# Patient Record
Sex: Female | Born: 1970 | Race: Black or African American | Hispanic: No | Marital: Single | Smoking: Former smoker
Health system: Southern US, Community
[De-identification: ages and names within clinical notes are randomized; demographics above are authoritative.]

## PROBLEM LIST (undated history)

## (undated) ENCOUNTER — Inpatient Hospital Stay (HOSPITAL_COMMUNITY): Payer: Self-pay

## (undated) DIAGNOSIS — E876 Hypokalemia: Secondary | ICD-10-CM

## (undated) DIAGNOSIS — E0590 Thyrotoxicosis, unspecified without thyrotoxic crisis or storm: Secondary | ICD-10-CM

## (undated) DIAGNOSIS — E079 Disorder of thyroid, unspecified: Secondary | ICD-10-CM

## (undated) DIAGNOSIS — I10 Essential (primary) hypertension: Secondary | ICD-10-CM

## (undated) DIAGNOSIS — K219 Gastro-esophageal reflux disease without esophagitis: Secondary | ICD-10-CM

## (undated) DIAGNOSIS — E039 Hypothyroidism, unspecified: Secondary | ICD-10-CM

## (undated) HISTORY — PX: DILATION AND CURETTAGE OF UTERUS: SHX78

## (undated) SURGERY — Surgical Case
Anesthesia: *Unknown

---

## 2003-06-27 ENCOUNTER — Emergency Department (HOSPITAL_COMMUNITY): Admission: EM | Admit: 2003-06-27 | Discharge: 2003-06-27 | Payer: Self-pay | Admitting: Emergency Medicine

## 2004-10-03 ENCOUNTER — Emergency Department (HOSPITAL_COMMUNITY): Admission: EM | Admit: 2004-10-03 | Discharge: 2004-10-03 | Payer: Self-pay | Admitting: Family Medicine

## 2004-10-06 ENCOUNTER — Ambulatory Visit: Payer: Self-pay | Admitting: Internal Medicine

## 2005-06-14 ENCOUNTER — Inpatient Hospital Stay (HOSPITAL_COMMUNITY): Admission: AD | Admit: 2005-06-14 | Discharge: 2005-06-14 | Payer: Self-pay | Admitting: Obstetrics and Gynecology

## 2005-06-17 ENCOUNTER — Inpatient Hospital Stay (HOSPITAL_COMMUNITY): Admission: AD | Admit: 2005-06-17 | Discharge: 2005-06-17 | Payer: Self-pay | Admitting: *Deleted

## 2006-03-19 ENCOUNTER — Ambulatory Visit: Payer: Self-pay | Admitting: Internal Medicine

## 2006-03-20 ENCOUNTER — Ambulatory Visit (HOSPITAL_COMMUNITY): Admission: RE | Admit: 2006-03-20 | Discharge: 2006-03-20 | Payer: Self-pay | Admitting: Internal Medicine

## 2006-06-14 ENCOUNTER — Ambulatory Visit: Payer: Self-pay | Admitting: Internal Medicine

## 2006-06-15 ENCOUNTER — Ambulatory Visit: Payer: Self-pay | Admitting: *Deleted

## 2007-01-24 ENCOUNTER — Emergency Department (HOSPITAL_COMMUNITY): Admission: EM | Admit: 2007-01-24 | Discharge: 2007-01-24 | Payer: Self-pay | Admitting: Emergency Medicine

## 2007-02-06 ENCOUNTER — Encounter (INDEPENDENT_AMBULATORY_CARE_PROVIDER_SITE_OTHER): Payer: Self-pay | Admitting: *Deleted

## 2007-03-08 ENCOUNTER — Emergency Department (HOSPITAL_COMMUNITY): Admission: EM | Admit: 2007-03-08 | Discharge: 2007-03-08 | Payer: Self-pay | Admitting: *Deleted

## 2007-09-02 ENCOUNTER — Inpatient Hospital Stay (HOSPITAL_COMMUNITY): Admission: AD | Admit: 2007-09-02 | Discharge: 2007-09-02 | Payer: Self-pay | Admitting: Obstetrics & Gynecology

## 2007-10-14 ENCOUNTER — Inpatient Hospital Stay (HOSPITAL_COMMUNITY): Admission: AD | Admit: 2007-10-14 | Discharge: 2007-10-16 | Payer: Self-pay | Admitting: Obstetrics and Gynecology

## 2007-10-14 ENCOUNTER — Encounter (INDEPENDENT_AMBULATORY_CARE_PROVIDER_SITE_OTHER): Payer: Self-pay | Admitting: Obstetrics and Gynecology

## 2008-09-03 IMAGING — CT CT HEAD W/O CM
1 series · 16 of 30 positions shown, 20 images · IV contrast (agent unspecified)
Comparison: none

CLINICAL DATA: Right arm numbness and weakness.   Recent syncopal episodes.  
 HEAD CT WITHOUT CONTRAST:
TECHNIQUE: Contiguous axial images were obtained from the base of the skull through the vertex according to standard protocol without contrast.

[Series 2: head_seq 4.5 h45s st · axial · 0.43mm/px · z∈[+1257,+1401]mm · 16 of 36 slices shown, 20 images]
[im 2/36  brain]
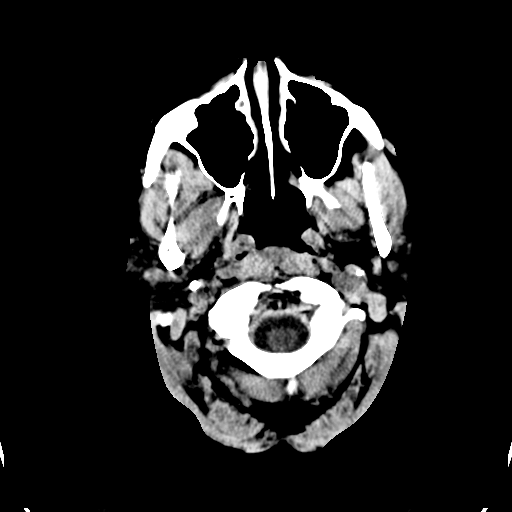
[im 2/36  bone]
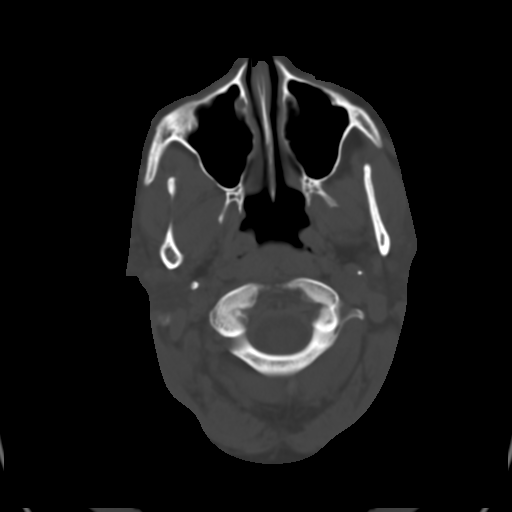
[im 4/36  brain]
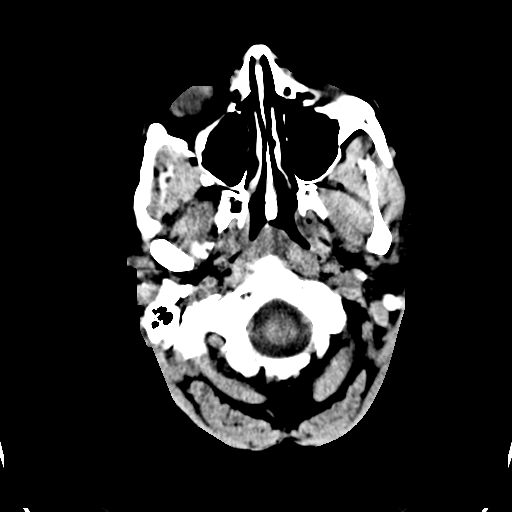
[im 7/36  brain]
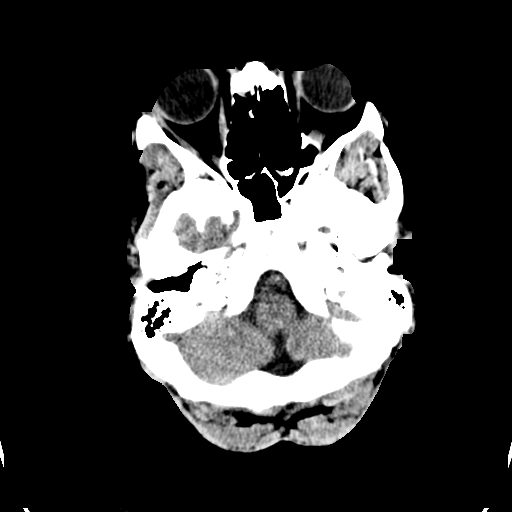
[im 9/36  brain]
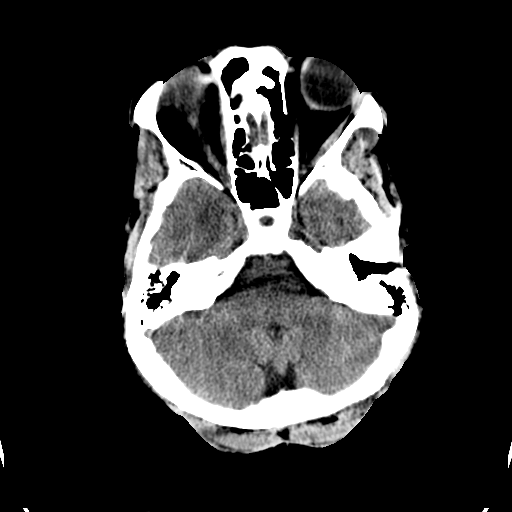
[im 10/36  brain]
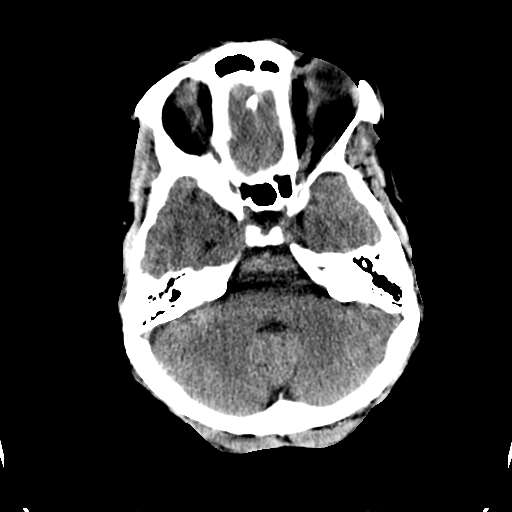
[im 10/36  bone]
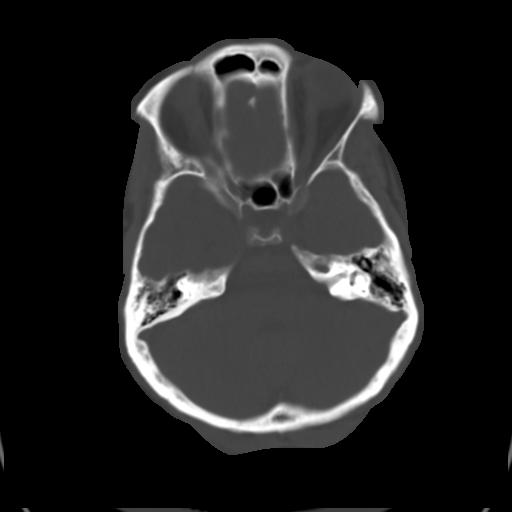
[im 13/36  brain]
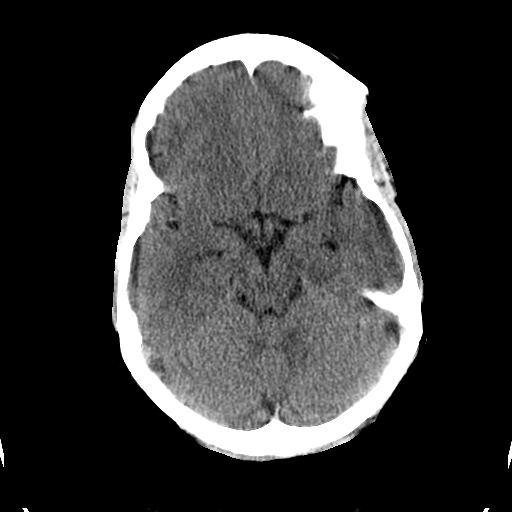
[im 15/36  brain]
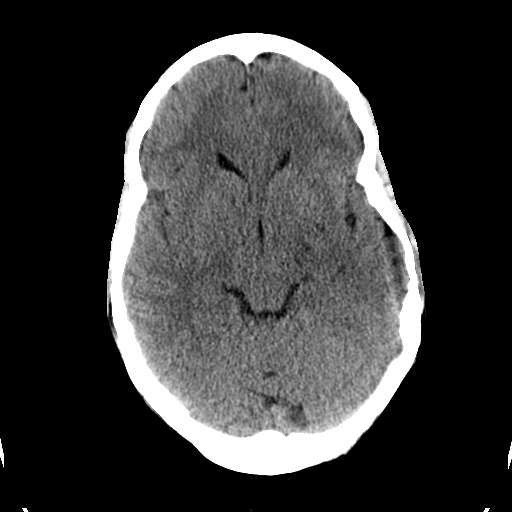
[im 17/36  brain]
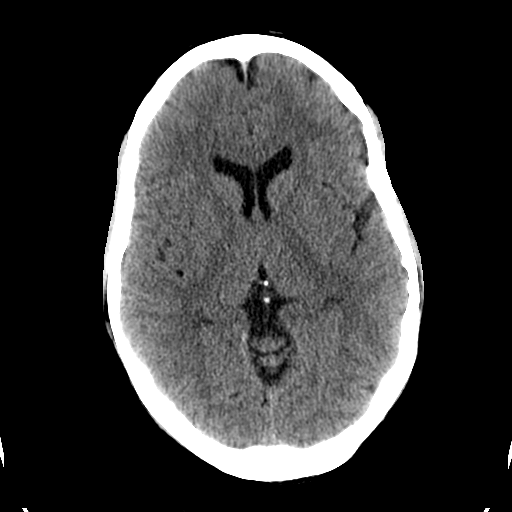
[im 19/36  brain]
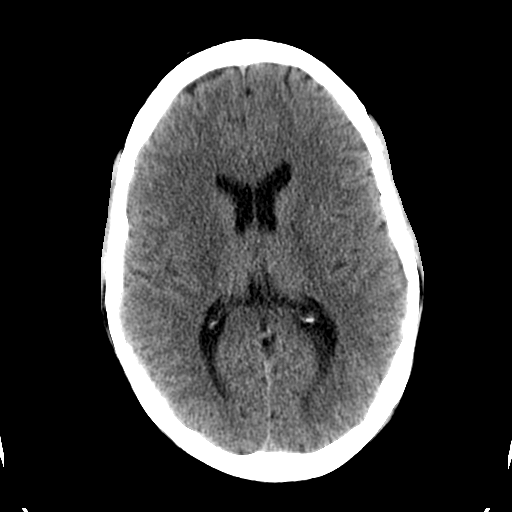
[im 19/36  bone]
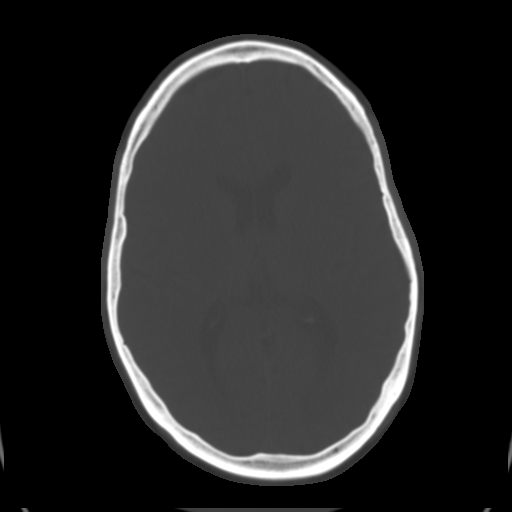
[im 21/36  brain]
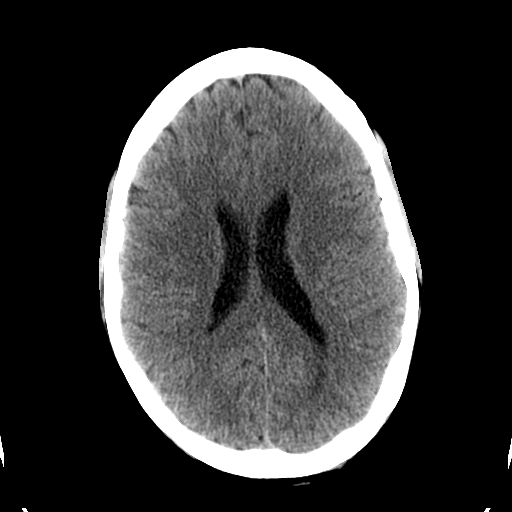
[im 23/36  brain]
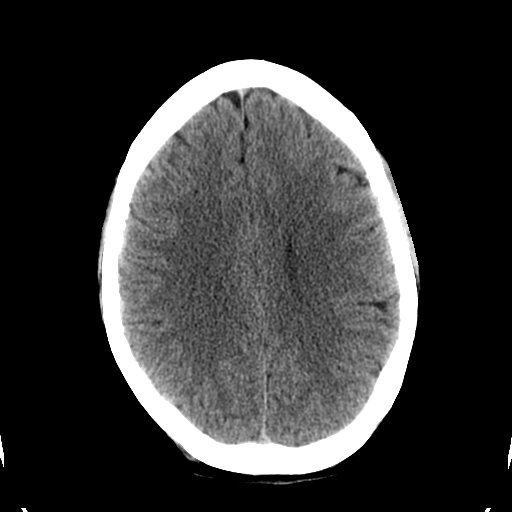
[im 26/36  brain]
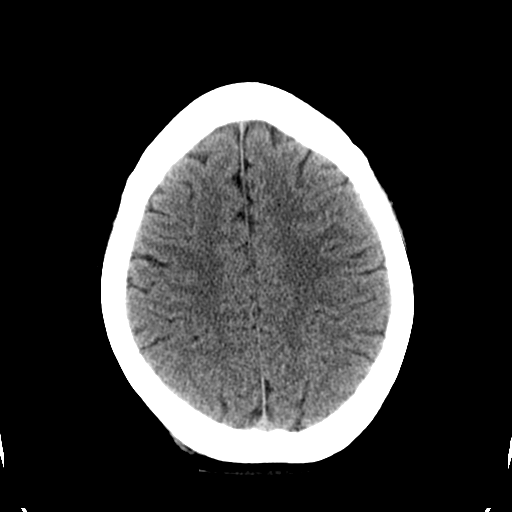
[im 27/36  brain]
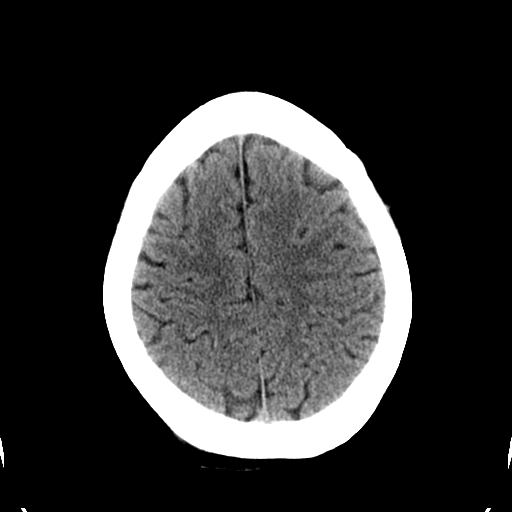
[im 27/36  bone]
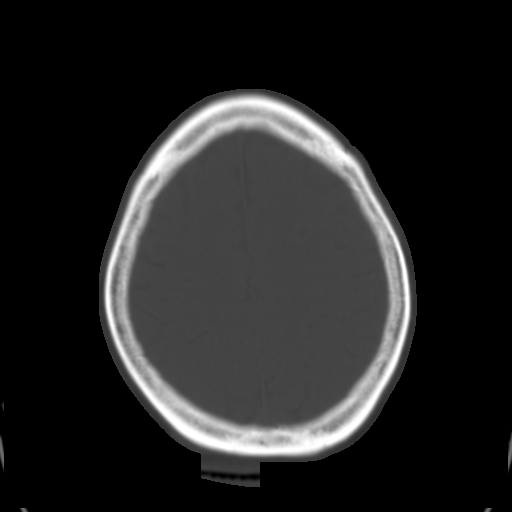
[im 29/36  brain]
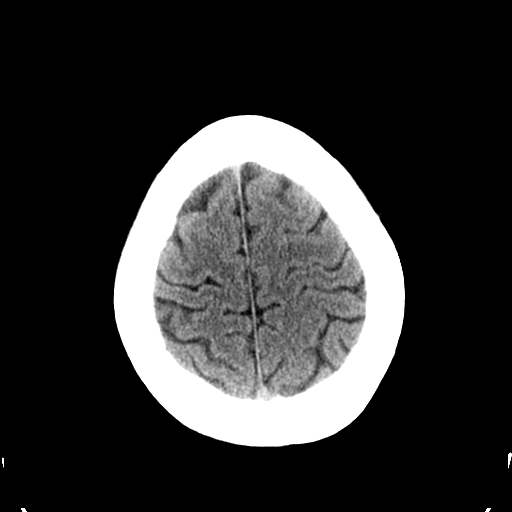
[im 32/36  brain]
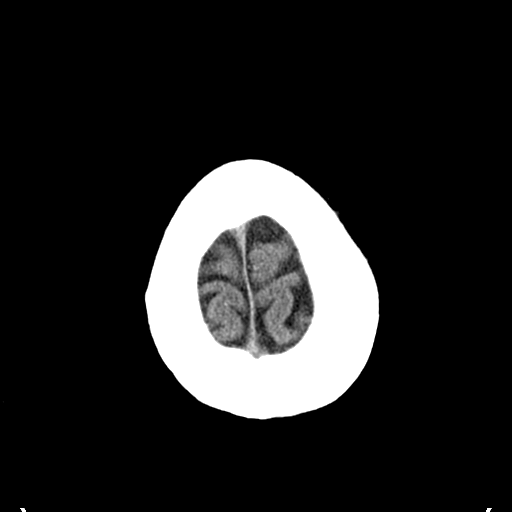
[im 34/36  brain]
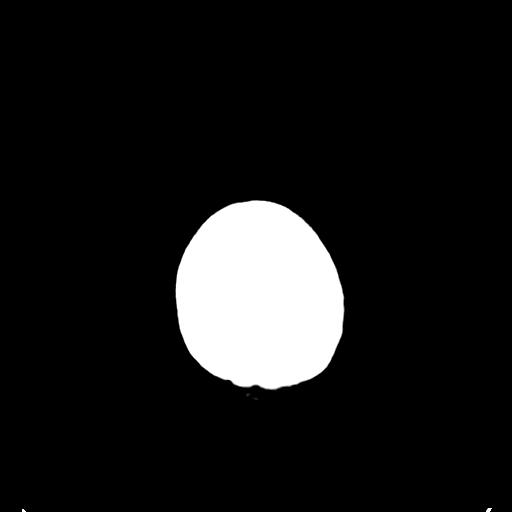

[16 of 30 positions shown; findings below may reference images not displayed]

FINDINGS: There is no evidence of intracranial hemorrhage, brain edema, acute 
 infarct, mass lesion, or mass effect.  No other intra-axial abnormalities 
 are seen, and the ventricles are within normal limits.  No abnormal 
 extra-axial fluid collections or masses are identified.  No skull 
 abnormalities are noted.
IMPRESSION: Negative non-contrast head CT.

## 2009-06-27 ENCOUNTER — Emergency Department (HOSPITAL_COMMUNITY): Admission: EM | Admit: 2009-06-27 | Discharge: 2009-06-27 | Payer: Self-pay | Admitting: Family Medicine

## 2009-12-02 ENCOUNTER — Emergency Department (HOSPITAL_COMMUNITY): Admission: EM | Admit: 2009-12-02 | Discharge: 2009-12-02 | Payer: Self-pay | Admitting: Family Medicine

## 2010-06-07 ENCOUNTER — Emergency Department (HOSPITAL_COMMUNITY)
Admission: EM | Admit: 2010-06-07 | Discharge: 2010-06-07 | Payer: Self-pay | Source: Home / Self Care | Admitting: Emergency Medicine

## 2010-06-08 LAB — POCT I-STAT, CHEM 8
BUN: 18 mg/dL (ref 6–23)
Calcium, Ion: 1.21 mmol/L (ref 1.12–1.32)
Chloride: 103 mEq/L (ref 96–112)
Creatinine, Ser: 0.9 mg/dL (ref 0.4–1.2)
Glucose, Bld: 78 mg/dL (ref 70–99)
HCT: 38 % (ref 36.0–46.0)
Hemoglobin: 12.9 g/dL (ref 12.0–15.0)
Potassium: 3.6 mEq/L (ref 3.5–5.1)
Sodium: 141 mEq/L (ref 135–145)
TCO2: 31 mmol/L (ref 0–100)

## 2010-06-08 LAB — POCT PREGNANCY, URINE: Preg Test, Ur: NEGATIVE

## 2010-07-19 ENCOUNTER — Inpatient Hospital Stay (INDEPENDENT_AMBULATORY_CARE_PROVIDER_SITE_OTHER)
Admission: RE | Admit: 2010-07-19 | Discharge: 2010-07-19 | Disposition: A | Discharge status: Home / Self Care | Payer: Self-pay | Source: Ambulatory Visit | Attending: Family Medicine | Admitting: Family Medicine

## 2010-07-19 DIAGNOSIS — M7750 Other enthesopathy of unspecified foot: Secondary | ICD-10-CM

## 2010-10-04 NOTE — Op Note (Signed)
Danielle [name], DANIS              ACCOUNT NO.:  [id number]   MEDICAL RECORD NO.:  [id number]          PATIENT TYPE:  INP   LOCATION:  9303                          FACILITY:  [facility]   PHYSICIAN:  Carrington Clamp, M.D. DATE OF BIRTH:  [birth year]   DATE OF PROCEDURE:  10/14/2007  DATE OF DISCHARGE:                               OPERATIVE REPORT   PREOPERATIVE DIAGNOSIS:  Intrauterine fetal demise at term with history  of previous cesarean section x2.   POSTOPERATIVE DIAGNOSIS:  Intrauterine fetal demise at term with history  of previous cesarean section x2.   PROCEDURE:  Repeat low-transverse cesarean section.   SURGEON:  Carrington Clamp, MD   ASSISTANT:  None.   ANESTHESIA:  Spinal.   FINDINGS:  Female infant vertex presentation with thick meconium on entry  into the amnion.  There was no nuchal cord seen.  The female infant  appeared in normal phenotypic and anatomic appearance.  The baby was  neither macerated nor edematous.  The cord was, however, well clotted.  Apgars were 0 and 0.  There were normal tubes, ovaries, and uterus  otherwise seen.   SPECIMENS:  Placenta to pathology.   ESTIMATED BLOOD LOSS:  600.   IV FLUIDS:  2800.   URINE OUTPUT:  200.   COMPLICATIONS:  None.   MEDICATIONS:  Pitocin and Ancef.   COUNTS:  Correct x3.   REASON FOR OPERATION:  Mrs. Marik called the office this morning to  speak to myself and doctor on-call to indicate that she had passed her  mucus plug.  When I asked her if she had also felt the baby move, she  indicated that she had not felt the baby move for a while.  I asked her  to come in to get checked out.  On presentation to the maternal  admissions unit, the nurse was unable to find heart tones with the  Doppler and this was confirmed by both bedside ultrasound by myself and  [facility] radiology ultrasound with Doppler color flow as well.  After discussing with the patient, the risks, benefits, and alternatives  of  repeat cesarean section as she had been planned versus a labor with  the cervix of only 1 cm, the patient agreed that the repeat cesarean  section to avoid the risk of uterine rupture in labor to help preserve  her health was the best plan and we proceeded to the operating room for  the cesarean section.   TECHNIQUE:  After adequate spinal anesthesia was achieved, the patient  was prepped and draped in usual sterile fashion in dorsal supine  position.  A Pfannenstiel skin incision was made with a scalpel and  carried down to the fascia with the Bovie cautery.  The fascia was  incised in midline and carried in transverse curvilinear manner with the  Mayo scissors.  A bowel free portion of the peritoneum was entered into  carefully with the Metzenbaum scissors and the rectus muscles were split  with the Mayo scissors and then the peritoneum incised in a superior to  inferior manner with the Metzenbaum scissors with  good visualization of  the bowel and bladder.  A small portion of the omentum was noted to be  adhesed to the peritoneum and this was removed with Bovie cautery with  good hemostasis.   The bladder blade was placed and the vesicouterine fascia tented up and  incised in a transverse curvilinear manner including adhesions of the  peritoneum to the anterior fundus.  The vesicouterine fascia was  dissected bluntly and sharply with the Metzenbaum scissors and the  bladder blade replaced after the bladder flap had been created.  A 2-cm  incision was made in the upper portion of lower uterine segment which  did not appear labored.  This was done until the amnion was seen and  then extended in transverse curvilinear manner with blunt dissection.  The amnion was ruptured with an Allis clamp and the baby identified in  the vertex presentation and delivered without complication.  The cord  which was clearly clotted off was clamped and cut.  The baby was handed  to awaiting labor and  delivery nurse who then was able to prepare the  baby for the parents to see later.  I was able to inspect the baby at  this time and found it to be phenotypically and anatomically normal in  appearance from the outside.  There had been no nuchal cord.   The placenta was then delivered manually and the uterus exteriorized,  wrapped in wet lap, and cleared of all debris.  A 0 Monocryl suture was  used in a running locked fashion to close the uterine incision.  An  imbricating layer of 0 Monocryl was performed as well.  An additional  figure-of-eight stitch was used to ensure hemostasis.  The uterus was  then reapproximated in the abdominal cavity and the abdomen was cleared  of all debris with irrigation.  The uterine incision was reinspected and  found to be hemostatic.  Once the patient was no longer vomiting and the  bowels could be replaced inside the abdominal cavity which had been  covered with a wet lap during the vomiting episode.  The peritoneum was  then closed with a running stitch of 2-0 Vicryl with the aid of a Fish.  This incorporated the rectus muscles as a separate layer.  The fascia  was then closed with running stitch of 0 Vicryl.  The subcutaneous  tissue was rendered hemostatic with Bovie cautery and irrigation.  This  was then closed with interrupted stitches of 2-0 plain gut.  The skin  was closed with staples.  The patient tolerated this procedure well and  was informed of the findings of the baby and that we would be sending  the placenta to the pathology and that blood work had been done to try  and find out a possible cause for the IFD.  The patient was also warned  that there might be no cause found.  The patient tolerated the procedure  well and she returned to recovery in stable condition.      Carrington Clamp, M.D.  Electronically Signed     MH/MEDQ  D:  10/14/2007  T:  10/14/2007  Job:  [id number]

## 2010-10-07 NOTE — Discharge Summary (Signed)
Danielle [name], Danielle [name]              ACCOUNT NO.:  [id number]   MEDICAL RECORD NO.:  [id number]          PATIENT TYPE:  INP   LOCATION:  9303                          FACILITY:  [facility]   PHYSICIAN:  Malva Limes, M.D.    DATE OF BIRTH:  [birth year]   DATE OF ADMISSION:  10/14/2007  DATE OF DISCHARGE:  10/16/2007                               DISCHARGE SUMMARY   FINAL DIAGNOSES:  1. Intrauterine fetal demise at term.  2. History of prior cesarean section x2.   PROCEDURE:  Repeat low-transverse cesarean section.   SURGEON:  Carrington Clamp, MD   COMPLICATIONS:  None.   This 40 year old, G7, P2-0-5-1 called the office this morning to  indicate she had passed her mucous plug.  When asked if she had felt the  baby move, she indicated she had not felt the baby move for a while, and  therefore, was advised to come in and get checked.  On presentation to  the MAU, the nurse was unable to find fetal heart tones with the Doppler  and this was confirmed by bedside ultrasound by Dr. Henderson Cloud, herself.  After discussing with the patient, this intrauterine fetal demise at  term and cervix was only 1 cm and history of 2 prior cesarean sections;  therefore, a decision was made to proceed with a cesarean section.  The  patient's otherwise antepartum course up to this point had been  complicated by history of hypothyroidism which she was on Synthroid for  during her pregnancy and her levels were checked every trimester and  were within normal limits.  The patient also had a history of chronic  hypertension, but was not on any medications during this pregnancy and  her blood pressures remained within normal limits.  Therefore, the  patient was taken to the operating room on Oct 14, 2007, by Dr. Carrington Clamp where a repeat low-transverse cesarean section was performed  with the delivery of a female infant in the vertex presentation with thick  meconium.  Infant appeared a normal phenotype and anatomy  appeared  normal.  The cord, however, was clotted and Apgars of course were 0 and  0.  The procedure went without complications.  The placenta was sent to  pathology.  The patient's postoperative course was stable.  She was felt  ready for discharge on postoperative day #2.  She was sent home on a  regular diet, told to decrease her activities, was given Percocet 1-2  every 4-6 hours as needed for pain, and was to follow up in our office  in 2 weeks.  Instructions and precautions were reviewed with the  patient.   LABORATORY DATA ON DISCHARGE:  She had a hemoglobin of 10.6 and white  blood cell count of 10.4.  A TORCH panel was performed and this seemed  to be normal as well.     [name], P.A.-C.    ______________________________  Malva Limes, M.D.   MB/MEDQ  D:  11/08/2007  T:  11/09/2007  Job:  [id number]

## 2011-02-14 LAB — COMPREHENSIVE METABOLIC PANEL
ALT: 16
AST: 16
Albumin: 2.8 — ABNORMAL LOW
Alkaline Phosphatase: 171 — ABNORMAL HIGH
BUN: 9
CO2: 23
Calcium: 8.6
Chloride: 103
Creatinine, Ser: 0.5
GFR calc Af Amer: >60
GFR calc non Af Amer: >60
Glucose, Bld: 72
Potassium: 3.7
Sodium: 134 — ABNORMAL LOW
Total Bilirubin: 0.4
Total Protein: 6.6

## 2011-02-14 LAB — CBC
HCT: 36.1
Hemoglobin: 12.4
MCHC: 34.5
MCV: 85.5
Platelets: 360
RBC: 4.22
RDW: 13.2
WBC: 12.1 — ABNORMAL HIGH

## 2011-02-14 LAB — TSH: TSH: 1.927

## 2011-02-15 LAB — CBC
HCT: 31.8 — ABNORMAL LOW
HCT: 32.2 — ABNORMAL LOW
HCT: 40.6
Hemoglobin: 10.6 — ABNORMAL LOW
Hemoglobin: 10.9 — ABNORMAL LOW
Hemoglobin: 13.6
MCHC: 33.3
MCHC: 33.3
MCHC: 33.8
MCV: 85.4
MCV: 85.5
MCV: 85.8
Platelets: 317
Platelets: 335
Platelets: 375
RBC: 3.7 — ABNORMAL LOW
RBC: 3.77 — ABNORMAL LOW
RBC: 4.76
RDW: 13.3
RDW: 13.4
RDW: 13.4
WBC: 10.4
WBC: 11.6 — ABNORMAL HIGH
WBC: 9.7

## 2011-02-15 LAB — TORCH-IGM(TOXO/ RUB/ CMV/ HSV) W TITER
CMV IgM: 0.59 IV
HSV IgM Antibody Titer: 0.34 IV
Rubella IgM Index: 0.63 IV
Toxoplasma IgM: 0.47 IV

## 2011-02-15 LAB — TORCH TITERS-IGG(TOXO/ RUB/ CMV/ HSV)
CMV IgG: 0.92 IV
HSV I/II IgG: >22.4 IV
Rubella IgG Scr: 69 IU/mL
Toxoplasma IgG Antibody (EIA): <5 IU/mL

## 2011-02-15 LAB — RPR: RPR Ser Ql: NONREACTIVE

## 2011-02-21 ENCOUNTER — Emergency Department (HOSPITAL_COMMUNITY): Payer: Self-pay

## 2011-02-21 ENCOUNTER — Emergency Department (HOSPITAL_COMMUNITY)
Admission: EM | Admit: 2011-02-21 | Discharge: 2011-02-21 | Disposition: A | Discharge status: Home / Self Care | Payer: Self-pay | Attending: Emergency Medicine | Admitting: Emergency Medicine

## 2011-02-21 DIAGNOSIS — M7750 Other enthesopathy of unspecified foot: Secondary | ICD-10-CM | POA: Insufficient documentation

## 2011-02-21 DIAGNOSIS — E039 Hypothyroidism, unspecified: Secondary | ICD-10-CM | POA: Insufficient documentation

## 2011-02-21 DIAGNOSIS — M79609 Pain in unspecified limb: Secondary | ICD-10-CM | POA: Insufficient documentation

## 2011-03-01 LAB — URINALYSIS, ROUTINE W REFLEX MICROSCOPIC
Bilirubin Urine: NEGATIVE
Glucose, UA: NEGATIVE
Ketones, ur: NEGATIVE
Leukocytes, UA: NEGATIVE
Nitrite: NEGATIVE
Protein, ur: NEGATIVE
Specific Gravity, Urine: 1.022
Urobilinogen, UA: 0.2
pH: 5.5

## 2011-03-01 LAB — GC/CHLAMYDIA PROBE AMP, GENITAL
Chlamydia, DNA Probe: NEGATIVE
GC Probe Amp, Genital: NEGATIVE

## 2011-03-01 LAB — WET PREP, GENITAL
Clue Cells Wet Prep HPF POC: NONE SEEN
Trich, Wet Prep: NONE SEEN
WBC, Wet Prep HPF POC: NONE SEEN
Yeast Wet Prep HPF POC: NONE SEEN

## 2011-03-01 LAB — PREGNANCY, URINE: Preg Test, Ur: POSITIVE

## 2011-03-01 LAB — URINE MICROSCOPIC-ADD ON

## 2011-03-01 LAB — HCG, QUANTITATIVE, PREGNANCY: hCG, Beta Chain, Quant, S: 70841 — ABNORMAL HIGH

## 2011-03-03 LAB — GC/CHLAMYDIA PROBE AMP, GENITAL
Chlamydia, DNA Probe: NEGATIVE
GC Probe Amp, Genital: NEGATIVE

## 2011-03-03 LAB — POCT PREGNANCY, URINE: Preg Test, Ur: NEGATIVE

## 2011-05-26 ENCOUNTER — Other Ambulatory Visit (HOSPITAL_COMMUNITY): Payer: Self-pay | Admitting: Physician Assistant

## 2011-05-26 DIAGNOSIS — Z1231 Encounter for screening mammogram for malignant neoplasm of breast: Secondary | ICD-10-CM

## 2011-06-28 ENCOUNTER — Ambulatory Visit (HOSPITAL_COMMUNITY): Payer: Self-pay

## 2011-06-30 ENCOUNTER — Ambulatory Visit (HOSPITAL_COMMUNITY)
Admission: RE | Admit: 2011-06-30 | Discharge: 2011-06-30 | Disposition: A | Discharge status: Home / Self Care | Payer: Self-pay | Source: Ambulatory Visit | Attending: Physician Assistant | Admitting: Physician Assistant

## 2011-06-30 DIAGNOSIS — Z1231 Encounter for screening mammogram for malignant neoplasm of breast: Secondary | ICD-10-CM

## 2012-01-02 ENCOUNTER — Emergency Department (HOSPITAL_COMMUNITY)
Admission: EM | Admit: 2012-01-02 | Discharge: 2012-01-03 | Disposition: A | Discharge status: Home / Self Care | Payer: Self-pay | Attending: Emergency Medicine | Admitting: Emergency Medicine

## 2012-01-02 ENCOUNTER — Encounter (HOSPITAL_COMMUNITY): Payer: Self-pay | Admitting: *Deleted

## 2012-01-02 DIAGNOSIS — A499 Bacterial infection, unspecified: Secondary | ICD-10-CM | POA: Insufficient documentation

## 2012-01-02 DIAGNOSIS — F17200 Nicotine dependence, unspecified, uncomplicated: Secondary | ICD-10-CM | POA: Insufficient documentation

## 2012-01-02 DIAGNOSIS — N760 Acute vaginitis: Secondary | ICD-10-CM | POA: Insufficient documentation

## 2012-01-02 DIAGNOSIS — B9689 Other specified bacterial agents as the cause of diseases classified elsewhere: Secondary | ICD-10-CM | POA: Insufficient documentation

## 2012-01-02 DIAGNOSIS — Z3490 Encounter for supervision of normal pregnancy, unspecified, unspecified trimester: Secondary | ICD-10-CM

## 2012-01-02 DIAGNOSIS — Z3201 Encounter for pregnancy test, result positive: Secondary | ICD-10-CM | POA: Insufficient documentation

## 2012-01-02 HISTORY — DX: Disorder of thyroid, unspecified: E07.9

## 2012-01-02 LAB — CBC WITH DIFFERENTIAL/PLATELET
Basophils Absolute: 0 10*3/uL (ref 0.0–0.1)
Basophils Relative: 0 % (ref 0–1)
Eosinophils Absolute: 0.2 10*3/uL (ref 0.0–0.7)
MCH: 29.7 pg (ref 26.0–34.0)
MCHC: 33.8 g/dL (ref 30.0–36.0)
Monocytes Absolute: 0.6 10*3/uL (ref 0.1–1.0)
Neutro Abs: 5.3 10*3/uL (ref 1.7–7.7)
Neutrophils Relative %: 57 % (ref 43–77)
RDW: 12.8 % (ref 11.5–15.5)

## 2012-01-02 LAB — URINALYSIS, ROUTINE W REFLEX MICROSCOPIC
Bilirubin Urine: NEGATIVE
Ketones, ur: NEGATIVE mg/dL
Nitrite: NEGATIVE
Urobilinogen, UA: 1 mg/dL (ref 0.0–1.0)
pH: 6.5 (ref 5.0–8.0)

## 2012-01-02 LAB — HCG, QUANTITATIVE, PREGNANCY: hCG, Beta Chain, Quant, S: 5713 m[IU]/mL — ABNORMAL HIGH (ref <5)

## 2012-01-02 LAB — URINE MICROSCOPIC-ADD ON

## 2012-01-02 LAB — BASIC METABOLIC PANEL
Chloride: 100 mEq/L (ref 96–112)
Creatinine, Ser: 0.72 mg/dL (ref 0.50–1.10)
GFR calc Af Amer: >90 mL/min (ref >90)
GFR calc non Af Amer: >90 mL/min (ref >90)
Potassium: 3.9 mEq/L (ref 3.5–5.1)

## 2012-01-02 NOTE — ED Notes (Signed)
Pt is here with a couple weeks of lower abdominal cramping, headaches, nauseated, vomiting intermittent, diarrhea and constipation.  LMP June took IUD out on June 7th and had a period after that.  No urinary symptoms

## 2012-01-02 NOTE — ED Provider Notes (Signed)
History     CSN: [id number]  Arrival date & time 01/02/12  1216   First MD Initiated Contact with Patient 01/02/12 2159      Chief Complaint  Patient presents with  . Abdominal Cramping    (Consider location/radiation/quality/duration/timing/severity/associated sxs/prior treatment) HPI Comments: Pt presents ambulatory with almost 3 weeks of vague, intermittent abdominal cramping, nausea, vomiting, and loose stools.  She is concerned that she may be pregnant.  She denies any significant pain, fevers, or bleeding and states she has not had a period in 2+ months.  Patient is a 41 y.o. female presenting with cramps. The history is provided by the patient. No language interpreter was used.  Abdominal Cramping The primary symptoms of the illness include fatigue, nausea, vomiting and diarrhea. The primary symptoms of the illness do not include fever, shortness of breath, hematemesis, hematochezia, dysuria, vaginal discharge or vaginal bleeding. The current episode started more than 2 days ago (2-3 weeks). The onset of the illness was gradual. The problem has not changed since onset. The nausea is associated with eating. The nausea is exacerbated by food, motion and activity.  The patient has had a change in bowel habit. Additional symptoms associated with the illness include constipation. Symptoms associated with the illness do not include chills, anorexia, diaphoresis, heartburn, urgency, hematuria, frequency or back pain. 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 Significant associated medical issues do not include PUD, GERD, inflammatory bowel disease, diabetes, sickle cell disease, gallstones, liver disease, diverticulitis or cardiac disease.    Past Medical History  Diagnosis Date  . Thyroid disease     hyper and hypo    Past Surgical History  Procedure Date  . Cesarean section     No family history on file.  History  Substance Use Topics  . Smoking  status: Current Everyday Smoker  . Smokeless tobacco: Not on file  . Alcohol Use: No    OB History    Grav Para Term Preterm Abortions TAB SAB Ect Mult Living                  Review of Systems  Constitutional: Positive for appetite change and fatigue. Negative for fever, chills, diaphoresis and activity change.  HENT: Negative.   Eyes: Negative.   Respiratory: Negative for shortness of breath.   Cardiovascular: Negative.   Gastrointestinal: Positive for nausea, vomiting, diarrhea and constipation. Negative for heartburn, hematochezia, anorexia and hematemesis.  Genitourinary: Positive for menstrual problem. Negative for dysuria, urgency, frequency, hematuria, flank pain, vaginal bleeding, vaginal discharge, vaginal pain and pelvic pain.  Musculoskeletal: Negative for back pain.  Neurological: Negative.   Hematological: Negative.   Psychiatric/Behavioral: Negative.     Allergies  Latex  Home Medications   Current Outpatient Rx  Name Route Sig Dispense Refill  . ADULT MULTIVITAMIN W/MINERALS CH Oral Take 1 tablet by mouth daily.      BP 132/89  Pulse 80  Temp 98.9 F (37.2 C) (Oral)  Resp 17  SpO2 100%  Physical Exam  Constitutional: She is oriented to person, place, and time. She appears well-developed and well-nourished. No distress.  HENT:  Head: Normocephalic and atraumatic.  Right Ear: External ear normal.  Left Ear: External ear normal.  Nose: Nose normal.  Mouth/Throat: Oropharynx is clear and moist. No oropharyngeal exudate.  Eyes: Conjunctivae and EOM are normal. Pupils are equal, round, and reactive to light. Right eye exhibits no discharge. Left eye exhibits no discharge. No scleral icterus.  Neck: Normal  range of motion. Neck supple. No JVD present. No tracheal deviation present. No thyromegaly present.  Cardiovascular: Normal rate, regular rhythm, normal heart sounds and intact distal pulses.  Exam reveals no gallop and no friction rub.   No murmur  heard. Pulmonary/Chest: Breath sounds normal. No stridor. No respiratory distress. She has no wheezes. She has no rales. She exhibits no tenderness.  Abdominal: Soft. Bowel sounds are normal. She exhibits no distension and no mass. There is no tenderness. There is no rebound and no guarding.  Musculoskeletal: Normal range of motion. She exhibits no edema and no tenderness.  Lymphadenopathy:    She has no cervical adenopathy.  Neurological: She is alert and oriented to person, place, and time. No cranial nerve deficit.  Skin: Skin is warm and dry. No rash noted. She is not diaphoretic. No erythema. No pallor.  Psychiatric: She has a normal mood and affect. Her behavior is normal. Judgment and thought content normal.    ED Course  Procedures (including critical care time)  Labs Reviewed  URINALYSIS, ROUTINE W REFLEX MICROSCOPIC - Abnormal; Notable for the following:    Leukocytes, UA TRACE (*)     All other components within normal limits  POCT PREGNANCY, URINE - Abnormal; Notable for the following:    Preg Test, Ur POSITIVE (*)     All other components within normal limits  HCG, QUANTITATIVE, PREGNANCY - Abnormal; Notable for the following:    hCG, Beta Chain, Quant, S 5713 (*)     All other components within normal limits  CBC WITH DIFFERENTIAL  BASIC METABOLIC PANEL  URINE MICROSCOPIC-ADD ON   No results found.   No diagnosis found.    MDM  Pt presents for evaluation of mild intermittent abdominal cramping for over 2 weeks, nausea, and several sporadic episodes of loose stools.  She is pain free currently and has remained stable in over 9 hours here in the ER.  She has a positive urine pregnancy test and normal belly labs.  There is no evidence of a UTI.  Plan obtain a quant beta Hcg and pelvic exam.  If no tenderness encountered, will discharge home to follow-up for routine prenatal care.  Note no pain or abnormality on pelvic exam.  The wet prep is positive for clue cells,  plan treat for BV.        Tobin Chad, MD 01/03/12 [phone]

## 2012-01-02 NOTE — ED Notes (Signed)
The pts friend and she checked in together and her friend just went back advised of wait time.  She has been here over 6 hours pleasant

## 2012-01-03 LAB — WET PREP, GENITAL
Trich, Wet Prep: NONE SEEN
Yeast Wet Prep HPF POC: NONE SEEN

## 2012-01-03 MED ORDER — PRENATABS RX 29-1 MG PO TABS: 1.0000 | ORAL_TABLET | ORAL | Status: DC

## 2012-01-03 MED ORDER — METRONIDAZOLE 500 MG PO TABS: 500.0000 mg | ORAL_TABLET | Freq: Two times a day (BID) | ORAL | Status: AC

## 2012-01-04 LAB — GC/CHLAMYDIA PROBE AMP, GENITAL
Chlamydia, DNA Probe: NEGATIVE
GC Probe Amp, Genital: NEGATIVE

## 2012-02-09 ENCOUNTER — Inpatient Hospital Stay (HOSPITAL_COMMUNITY)
Admission: AD | Admit: 2012-02-09 | Discharge: 2012-02-09 | Disposition: A | Discharge status: Home / Self Care | Payer: Medicaid Other | Source: Ambulatory Visit | Attending: Obstetrics & Gynecology | Admitting: Obstetrics & Gynecology

## 2012-02-09 ENCOUNTER — Encounter (HOSPITAL_COMMUNITY): Payer: Self-pay | Admitting: *Deleted

## 2012-02-09 DIAGNOSIS — O99340 Other mental disorders complicating pregnancy, unspecified trimester: Secondary | ICD-10-CM | POA: Insufficient documentation

## 2012-02-09 DIAGNOSIS — F411 Generalized anxiety disorder: Secondary | ICD-10-CM | POA: Insufficient documentation

## 2012-02-09 DIAGNOSIS — O99891 Other specified diseases and conditions complicating pregnancy: Secondary | ICD-10-CM | POA: Insufficient documentation

## 2012-02-09 DIAGNOSIS — K219 Gastro-esophageal reflux disease without esophagitis: Secondary | ICD-10-CM | POA: Insufficient documentation

## 2012-02-09 DIAGNOSIS — R002 Palpitations: Secondary | ICD-10-CM | POA: Insufficient documentation

## 2012-02-09 HISTORY — DX: Thyrotoxicosis, unspecified without thyrotoxic crisis or storm: E05.90

## 2012-02-09 LAB — CBC WITH DIFFERENTIAL/PLATELET
Basophils Absolute: 0 10*3/uL (ref 0.0–0.1)
Eosinophils Absolute: 0.1 10*3/uL (ref 0.0–0.7)
Eosinophils Relative: 1 % (ref 0–5)
HCT: 32.5 % — ABNORMAL LOW (ref 36.0–46.0)
Lymphocytes Relative: 22 % (ref 12–46)
MCH: 28.9 pg (ref 26.0–34.0)
MCHC: 33.2 g/dL (ref 30.0–36.0)
MCV: 86.9 fL (ref 78.0–100.0)
Monocytes Absolute: 0.4 10*3/uL (ref 0.1–1.0)
Platelets: 339 10*3/uL (ref 150–400)
RDW: 12.7 % (ref 11.5–15.5)
WBC: 8.5 10*3/uL (ref 4.0–10.5)

## 2012-02-09 LAB — URINALYSIS, ROUTINE W REFLEX MICROSCOPIC
Bilirubin Urine: NEGATIVE
Ketones, ur: NEGATIVE mg/dL
Leukocytes, UA: NEGATIVE
Nitrite: NEGATIVE
Protein, ur: NEGATIVE mg/dL
pH: 7 (ref 5.0–8.0)

## 2012-02-09 LAB — BASIC METABOLIC PANEL
BUN: 15 mg/dL (ref 6–23)
CO2: 24 mEq/L (ref 19–32)
Calcium: 9.1 mg/dL (ref 8.4–10.5)
Chloride: 100 mEq/L (ref 96–112)
Creatinine, Ser: 0.53 mg/dL (ref 0.50–1.10)
GFR calc Af Amer: >90 mL/min (ref >90)

## 2012-02-09 NOTE — MAU Note (Signed)
Palpitations in throat past couple wks.  Thought it would go away, it's not.  Hx of thyroid problems. IUD removed June 7, did have period,  + preg test at [facility] on 08/13.  Did not do an [place]

## 2012-02-09 NOTE — MAU Provider Note (Signed)
History     CSN: [id number]  Arrival date and time: 02/09/12 1453   Provider at bedside at 1630.    Chief Complaint  Patient presents with  . Palpitations   HPI Patient is a 41 y.o., [id number] AA female at [redacted]w[redacted]d who presents to the MAU complaining of heart palpitations for 2 weeks. She describes these as a feeling in her throat that extends to the left side of her chest like a "rush like I am startled," similar to how she has felt before with anxiety attacks.  She reports her heart racing intermittently. Palpitations usually last less than one minute in duration and occur multiple times daily.  Patient denies doing anything in particular to incite these events.  Has had minimal chest discomfort with some of the episodes.  Denies any significant pain, radiation of pain, chest tightness, chest pressure, or exertional component.  Denies shortness of breath. Does note heart palpitations upon standing too quickly, as well as light headedness and dizziness.  No episodes of syncope.  Has a history of high blood pressure but does not take any medications for this.  Has a hx of hypothyroidism and anemia.  She is not taking anything for these conditions either and has no medical provider.  Denies caffeine use, h/o trauma, blood loss, fevers, chills, syncope, shortness of breath, gynecologic concerns.  Has been having a significant increase in stress of late due to family issues with her young daughter.   Reports acid reflux over the past year ut denies difficulty swallowing.  Denies symptoms now.  Reports some cold intolerance at night but also reports occasionally feeling hot at night.  Denies dry skin, hair loss, and vision changes.  No prenatal care this pregnancy.  Wants to go to [facility].  No PCP.   OB History    Grav Para Term Preterm Abortions TAB SAB Ect Mult Living   7 3 3  3  0 2 1 0 1    Reports previous deliveries all by c-section. Reports fetal loss with first pregnancy due to  anoxic brain injury in third trimester because "baby was not taken out quickly enough". Reports fetal loss with most recent pregnancy due to umbilical cord clot.  Past Medical History  Diagnosis Date  . Thyroid disease     hyper and hypo  . Hyperthyroidism   "intermittent hypertension" "anemia" No PCP following medical issues because pt reports cannot afford insurance. No medications.  Past Surgical History  Procedure Date  . Cesarean section     History reviewed. No pertinent family history.  Per pt, h/o HTN and DM in mother and father, as well as thyroid disease Denies cardiac FH  History  Substance Use Topics  . Smoking status: Previous Every Day Smoker, quit 2 months ago  . Smokeless tobacco: Not on file  . Alcohol Use: No    Allergies:  Allergies  Allergen Reactions  . Latex Hives    On contact areas    Prescriptions prior to admission  Medication Sig Dispense Refill  . ibuprofen (ADVIL,MOTRIN) 200 MG tablet Take 200 mg by mouth every 6 (six) hours as needed. Head aches      . Prenatal Vit-Fe Fumarate-FA (PRENATAL MULTIVITAMIN) TABS Take 1 tablet by mouth daily.      Pt reports she does not take any medication.  Review of Systems  Constitutional: Negative for fever, chills, weight loss, malaise/fatigue and diaphoresis.  Respiratory: Negative.   Cardiovascular: Positive for chest pain and palpitations. Negative  for orthopnea, claudication, leg swelling and PND.  Gastrointestinal: Positive for constipation. Negative for heartburn, nausea, vomiting, abdominal pain, diarrhea, blood in stool and melena.  Genitourinary: Negative.   Neurological: Negative for weakness.     Physical Exam   Blood pressure 134/70, pulse 89, temperature 97.6 F (36.4 C), temperature source Oral, resp. rate 18, height 5' 5.5" (1.664 m), weight 88.451 kg (195 lb), last menstrual period 11/09/2011, SpO2 100.00%.  Physical Exam  Constitutional: She is oriented to person, place, and  time. She appears well-developed and well-nourished. No distress.  HENT:  Head: Normocephalic and atraumatic.  Eyes: Conjunctivae normal and EOM are normal.  Cardiovascular: Normal rate and regular rhythm.  Exam reveals no gallop and no friction rub.   Murmur heard.      2/6 systolic flow murmur   Respiratory: Effort normal and breath sounds normal. No respiratory distress. She has no wheezes. She has no rales. She exhibits no tenderness.  GI: Soft. Bowel sounds are normal. She exhibits no distension and no mass. There is no tenderness. There is no rebound and no guarding.  Neurological: She is alert and oriented to person, place, and time.  Skin: Skin is warm and dry. No rash noted. She is not diaphoretic. No erythema. No pallor.  MSK: Chest pain not reproducible HEENT: no thyromegaly PULM: nl effort EXTR: no edema Bedside sono : fetal heart tones auscultated   MAU Course  Procedures  MDM Results for orders placed during the hospital encounter of 02/09/12 (from the past 24 hour(s))  CBC WITH DIFFERENTIAL     Status: Abnormal   Collection Time   02/09/12  5:11 PM      Component Value Range   WBC 8.5  4.0 - 10.5 K/uL   RBC 3.74 (*) 3.87 - 5.11 MIL/uL   Hemoglobin 10.8 (*) 12.0 - 15.0 g/dL   HCT 82.9 (*) 56.2 - 13.0 %   MCV 86.9  78.0 - 100.0 fL   MCH 28.9  26.0 - 34.0 pg   MCHC 33.2  30.0 - 36.0 g/dL   RDW 86.5  78.4 - 69.6 %   Platelets 339  150 - 400 K/uL   Neutrophils Relative 72  43 - 77 %   Neutro Abs 6.1  1.7 - 7.7 K/uL   Lymphocytes Relative 22  12 - 46 %   Lymphs Abs 1.9  0.7 - 4.0 K/uL   Monocytes Relative 5  3 - 12 %   Monocytes Absolute 0.4  0.1 - 1.0 K/uL   Eosinophils Relative 1  0 - 5 %   Eosinophils Absolute 0.1  0.0 - 0.7 K/uL   Basophils Relative 0  0 - 1 %   Basophils Absolute 0.0  0.0 - 0.1 K/uL  BASIC METABOLIC PANEL     Status: Abnormal   Collection Time   02/09/12  5:22 PM      Component Value Range   Sodium 134 (*) 135 - 145 mEq/L   Potassium  3.5  3.5 - 5.1 mEq/L   Chloride 100  96 - 112 mEq/L   CO2 24  19 - 32 mEq/L   Glucose, Bld 95  70 - 99 mg/dL   BUN 15  6 - 23 mg/dL   Creatinine, Ser 2.95  0.50 - 1.10 mg/dL   Calcium 9.1  8.4 - 28.4 mg/dL   GFR calc non Af Amer >90  >90 mL/min   GFR calc Af Amer >90  >90 mL/min  URINALYSIS, ROUTINE W  REFLEX MICROSCOPIC     Status: Normal   Collection Time   02/09/12  5:30 PM      Component Value Range   Color, Urine YELLOW  YELLOW   APPearance CLEAR  CLEAR   Specific Gravity, Urine 1.020  1.005 - 1.030   pH 7.0  5.0 - 8.0   Glucose, UA NEGATIVE  NEGATIVE mg/dL   Hgb urine dipstick NEGATIVE  NEGATIVE   Bilirubin Urine NEGATIVE  NEGATIVE   Ketones, ur NEGATIVE  NEGATIVE mg/dL   Protein, ur NEGATIVE  NEGATIVE mg/dL   Urobilinogen, UA 0.2  0.0 - 1.0 mg/dL   Nitrite NEGATIVE  NEGATIVE   Leukocytes, UA NEGATIVE  NEGATIVE     Assessment and Plan  Patient is a 41 y.o., [id number] AA female at [redacted]w[redacted]d who presents to the MAU complaining of heart palpitations for 2 weeks.  1. Palpitations - Most likely anxiety or GERD as pt has history and is stressed currently.  CBC, UA, EKG, and BMET wnl making anemia, infection, cardiac abnormality/arrhythmia, and electrolyte abnormalities less likely.  However, cannot rule out cardiac etiology without EKG when pt having symptoms.  TSH ordered and pending. - F/u TSH as pt has h/o thyroid abnormality - Pt would benefit from prenatal care and regular medical care for more in-depth workup, including holter or event monitor, further workup of thyroid disease and medication management if necessary, evaluation for other endocrine issue such as DM, evaluation with thrombophilia panel (as endocrine abnormalities or blood clotting disorders can explain multiple SAB history and palpitations).  2. Pregnancy - Establish prenatal care as soon as possible, either at pt preferred [facility] or [facility].  Simone Curia 02/09/2012 8:44 PM  Originally created by Marcelline Mates, PA student, signed off to Simone Curia, MD, PGY-1.

## 2012-02-12 NOTE — MAU Provider Note (Signed)
I have seen and examined patient and reviewed EKG. Agree with above findings and plan. Napoleon Form, MD

## 2012-02-20 ENCOUNTER — Encounter (HOSPITAL_COMMUNITY): Payer: Self-pay

## 2012-02-20 ENCOUNTER — Inpatient Hospital Stay (HOSPITAL_COMMUNITY)
Admission: AD | Admit: 2012-02-20 | Discharge: 2012-02-20 | Disposition: A | Discharge status: Home / Self Care | Payer: Medicaid Other | Source: Ambulatory Visit | Attending: Family Medicine | Admitting: Family Medicine

## 2012-02-20 DIAGNOSIS — O99891 Other specified diseases and conditions complicating pregnancy: Secondary | ICD-10-CM | POA: Insufficient documentation

## 2012-02-20 DIAGNOSIS — R42 Dizziness and giddiness: Secondary | ICD-10-CM | POA: Insufficient documentation

## 2012-02-20 DIAGNOSIS — O2692 Pregnancy related conditions, unspecified, second trimester: Secondary | ICD-10-CM

## 2012-02-20 LAB — URINALYSIS, ROUTINE W REFLEX MICROSCOPIC
Bilirubin Urine: NEGATIVE
Hgb urine dipstick: NEGATIVE
Nitrite: NEGATIVE
Protein, ur: NEGATIVE mg/dL
Specific Gravity, Urine: 1.02 (ref 1.005–1.030)
Urobilinogen, UA: 0.2 mg/dL (ref 0.0–1.0)

## 2012-02-20 NOTE — MAU Provider Note (Signed)
History     CSN: [id number]  Arrival date and time: 02/20/12 1340   First Provider Initiated Contact with Patient 02/20/12 1417      Chief Complaint  Patient presents with  . Dizziness   HPI Danielle [name] is 41 y.o. [id number] [redacted]w[redacted]d weeks presenting with dizzy and not feeling well last night at work.  States she didn't eat well yesterday and thinks this could be the cause.  Denies visual changes, nausea, vomiting, or loss of balance.  Denies vaginal bleeding or discharge.  Patient is alert and oriented and is sitting up reading/studying.  She states she feels much better now.     Past Medical History  Diagnosis Date  . Thyroid disease     hyper and hypo  . Hyperthyroidism     Past Surgical History  Procedure Date  . Cesarean section     History reviewed. No pertinent family history.  History  Substance Use Topics  . Smoking status: Current Every Day Smoker  . Smokeless tobacco: Not on file  . Alcohol Use: No    Allergies:  Allergies  Allergen Reactions  . Latex Hives    On contact areas    Prescriptions prior to admission  Medication Sig Dispense Refill  . ibuprofen (ADVIL,MOTRIN) 200 MG tablet Take 200 mg by mouth every 6 (six) hours as needed. Head aches      . Prenatal Vit-Fe Fumarate-FA (PRENATAL MULTIVITAMIN) TABS Take 1 tablet by mouth daily.        Review of Systems  Constitutional: Negative.   HENT: Negative.   Eyes: Negative.   Respiratory: Negative.   Cardiovascular: Negative.   Gastrointestinal: Negative for nausea, vomiting and abdominal pain.  Genitourinary: Negative.   Neurological: Positive for dizziness.   Physical Exam   Blood pressure 137/71, pulse 89, resp. rate 18, height 5' 5.5" (1.664 m), weight 87.544 kg (193 lb), last menstrual period 11/09/2011.  Physical Exam  Constitutional: She is oriented to person, place, and time. She appears well-developed and well-nourished. No distress.  HENT:  Head: Normocephalic.  Cardiovascular:  Normal rate.   Respiratory: Effort normal.  GI: Soft. There is no tenderness.  Genitourinary:       Not indicated  Neurological: She is alert and oriented to person, place, and time.  Skin: Skin is warm and dry. She is not diaphoretic.  Psychiatric: She has a normal mood and affect. Her behavior is normal.    Bedside ultrasound (FHTs not heard with doppler long enough to get a rate)--- FHR 120-130 with + fetal movement observed. Results for orders placed during the hospital encounter of 02/20/12 (from the past 24 hour(s))  URINALYSIS, ROUTINE W REFLEX MICROSCOPIC     Status: Normal   Collection Time   02/20/12  1:56 PM      Component Value Range   Color, Urine YELLOW  YELLOW   APPearance CLEAR  CLEAR   Specific Gravity, Urine 1.020  1.005 - 1.030   pH 6.5  5.0 - 8.0   Glucose, UA NEGATIVE  NEGATIVE mg/dL   Hgb urine dipstick NEGATIVE  NEGATIVE   Bilirubin Urine NEGATIVE  NEGATIVE   Ketones, ur NEGATIVE  NEGATIVE mg/dL   Protein, ur NEGATIVE  NEGATIVE mg/dL   Urobilinogen, UA 0.2  0.0 - 1.0 mg/dL   Nitrite NEGATIVE  NEGATIVE   Leukocytes, UA NEGATIVE  NEGATIVE   MAU Course  Procedures  MDM 14:30  Discussed at length the importance of eating regular meals and staying well  hydrated.  Patient is waiting for Medicaid card to begin prenatal care  Assessment and Plan  A:  Dizziness in second gestation of pregnancy  P:  Discussed nutrition and importance of staying well hydrated    Begin prenatal care with doctor of your choice  Matt Holmes 02/20/2012, 2:17 PM

## 2012-02-20 NOTE — MAU Provider Note (Signed)
Chart reviewed and agree with management and plan.  

## 2012-02-20 NOTE — MAU Note (Signed)
Patient is in with c/o dizziness. She states that she felt an intense dizziness at work yesterday. She denies any pain, vaginal bleeding or discharge. 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

## 2012-03-08 ENCOUNTER — Encounter (HOSPITAL_COMMUNITY): Payer: Self-pay | Admitting: *Deleted

## 2012-03-29 ENCOUNTER — Other Ambulatory Visit: Payer: Self-pay

## 2012-03-30 LAB — OB RESULTS CONSOLE RUBELLA ANTIBODY, IGM: Rubella: IMMUNE

## 2012-03-30 LAB — OB RESULTS CONSOLE HIV ANTIBODY (ROUTINE TESTING): HIV: NONREACTIVE

## 2012-03-30 LAB — OB RESULTS CONSOLE RPR: RPR: NONREACTIVE

## 2012-04-02 ENCOUNTER — Other Ambulatory Visit: Payer: Self-pay | Admitting: Obstetrics

## 2012-04-02 DIAGNOSIS — Z3689 Encounter for other specified antenatal screening: Secondary | ICD-10-CM

## 2012-04-02 DIAGNOSIS — O09529 Supervision of elderly multigravida, unspecified trimester: Secondary | ICD-10-CM

## 2012-04-05 ENCOUNTER — Ambulatory Visit (HOSPITAL_COMMUNITY)
Admission: RE | Admit: 2012-04-05 | Discharge: 2012-04-05 | Disposition: A | Discharge status: Home / Self Care | Payer: Medicaid Other | Source: Ambulatory Visit | Attending: Obstetrics | Admitting: Obstetrics

## 2012-04-05 ENCOUNTER — Encounter (HOSPITAL_COMMUNITY): Payer: Self-pay

## 2012-04-05 DIAGNOSIS — O289 Unspecified abnormal findings on antenatal screening of mother: Secondary | ICD-10-CM | POA: Insufficient documentation

## 2012-04-05 DIAGNOSIS — O09529 Supervision of elderly multigravida, unspecified trimester: Secondary | ICD-10-CM | POA: Insufficient documentation

## 2012-04-05 DIAGNOSIS — O358XX0 Maternal care for other (suspected) fetal abnormality and damage, not applicable or unspecified: Secondary | ICD-10-CM | POA: Insufficient documentation

## 2012-04-05 DIAGNOSIS — O34219 Maternal care for unspecified type scar from previous cesarean delivery: Secondary | ICD-10-CM | POA: Insufficient documentation

## 2012-04-05 DIAGNOSIS — Z3689 Encounter for other specified antenatal screening: Secondary | ICD-10-CM

## 2012-04-05 DIAGNOSIS — Z363 Encounter for antenatal screening for malformations: Secondary | ICD-10-CM | POA: Insufficient documentation

## 2012-04-05 DIAGNOSIS — Z1389 Encounter for screening for other disorder: Secondary | ICD-10-CM | POA: Insufficient documentation

## 2012-04-05 DIAGNOSIS — O09299 Supervision of pregnancy with other poor reproductive or obstetric history, unspecified trimester: Secondary | ICD-10-CM | POA: Insufficient documentation

## 2012-04-05 NOTE — Consult Note (Signed)
MFM Note  Ms. Samonte is a 41 year old AA female at 18+ weeks who presents for consultation due to her age and poor obstetric history. Her OB history is as follows: (historian: patient)  G1: EAB by D&C without complications; ~ 1989  G2: Urgent C/S under general anesthesia at term; ~ 6 lbs female; infant stayed in NICU x 5 1/2 months before dying; patient states that she was not delivered soon enough; denies prenatal complications  G3: Ectopic; treated with medication; ~1993  G4: EAB by D&C without complications; ~ 1997  G5: Repeat C/S at term; 12+9 female; no prenatal or postnatal complications; 11/07/2000  G6: SAB; no D&C; + pregnancy test but no [place]; ~2005  G7: Repeat C/S after IUFD at 38+ weeks (2 days before scheduled C/S); denies any prenatal complications or postnatal complications; female; per operative note: thick meconium, no nuchal or body cord, no maceration, appeared to be phenotypically normal female; no autopsy or chromosomes; TORCH titers were negative; placental pathology: 1) slight acute chorioamnionitis 2) intervillositis with microabscess formation and infarcts 3) villitis also present 4) no organisms found in paraffin block; 10/14/2007  Medical hx: hyperthyroidism dx'ed in 1998 secondary to Grave's disease; rx'ed with medication; then pt decided to discontinue   Surgical hx: D&E x 2; C/S x 3  Medications: PNV  Allergies: none  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 Marczak received genetic counseling today; she decided to decline all prenatal testing; normal detailed [place] today 3) H/O IUFD at term; + meconium; unknown cause except for possible infectious etiology due to placental pathology findings - intervillositis/no organisms found 4) Hx hyperthyroidism; TSH levels normal 04/09 and 09/13  Recommendations: 1) Serial growth USs 2) Antenatal testing at 32 weeks 3) Baseline TSH  level 4) S/P C/S x 3 5) Based on her OB history, would offer delivery ~ 37-38 weeks by repeat C/S   It was a pleasure meeting Ms. Suchy today. Please call with any questions or concerns. Looking forward to following her with you.  (Face-to-face consultation with patient: 45 min)

## 2012-04-05 NOTE — Progress Notes (Signed)
Genetic Counseling  High-Risk Gestation Note  Appointment Date:  04/05/2012 Referred By: Brock Bad, MD Date of Birth:  [birth year]    Pregnancy History: [id number] Estimated Date of Delivery: 09/04/12 Estimated Gestational Age: [redacted]w[redacted]d Attending: Particia Nearing, MD    Ms. PEARLENE HICKMOTT was seen for genetic counseling regarding a maternal age of 41 y.o. and a screen positive risk for Down syndrome based on Quad screen.  She was counseled regarding maternal age and the association with risk for chromosome conditions due to nondisjunction with aging of the ova.   We reviewed chromosomes, nondisjunction, and the associated 1 in 5 risk for fetal aneuploidy related to a maternal age of 41 y.o. at [redacted]w[redacted]d gestation.  She was counseled that the risk for aneuploidy decreases as gestational age increases, accounting for those pregnancies which spontaneously abort.  We specifically discussed Down syndrome (trisomy 71), trisomies 36 and 71, and sex chromosome aneuploidies (47,XXX and 47,XXY) including the common features and prognoses of each.   We also reviewed Ms. Bezek's maternal serum Quad screen result and the associated decrease in risk for fetal Down syndrome (1 in 56 to 1 in 1 in 127). However, we discussed that 1 in 127 is considered screen positive given that it is above the lab's cutoff.  She was counseled regarding other explanations for a screen positive result including normal variation and differences in maternal metabolism.  In addition, we reviewed the screen adjusted reduction in risks for trisomy 18 (1 in 55) and ONTDs (1 in 5,550).  She understands that Quad screening provides a pregnancy specific risk for these conditions, but is not considered to be diagnostic.    We reviewed other available screening options including noninvasive prenatal testing (NIPT) and detailed ultrasound.  Specifically, we discussed that NIPT analyzes cell free fetal DNA found in the maternal circulation.  This test is not diagnostic for chromosome conditions, but can provide information regarding the presence or absence of extra fetal DNA for chromosomes 13, 18, 21, X, and Y, and missing fetal DNA for chromosome X and Y (Turner syndrome). Thus, it would not identify or rule out all genetic conditions. The reported detection rate is greater than 99% for Trisomy 21, greater than 98% for Trisomy 18, and is approximately 80% (8 out of 10) for Trisomy 13. The false positive rate is reported to be less than 0.1% for any of these conditions.  In addition, we discussed that ~50-80% of fetuses with Down syndrome and up to 90-95% of fetuses with trisomy 18/13, when well visualized, have detectable anomalies or soft markers by detailed ultrasound (~18+ weeks gestation).   Ms. ARRINGTON KREBSBACH was also counseled regarding diagnostic testing via amniocentesis.  We reviewed the approximate 1 in 300-500 risk for complications, including spontaneous pregnancy loss. After consideration of all the options, Ms. Przybyl elected to proceed with targeted ultrasound only and declined NIPT and amniocentesis today. The ultrasound report will be sent under separate cover.  Ms. Hoole was provided with written information regarding sickle cell anemia (SCA) including the carrier frequency and incidence in the African-American population, the availability of carrier testing and prenatal diagnosis if indicated.  In addition, we discussed that hemoglobinopathies are routinely screened for as part of the [facility] newborn screening panel.  We discussed that OB medical records indicated that sickle cell screening was performed and was negative, indicating that she does not have sickle cell trait. Screening for additional less common hemoglobin variants is available via hemoglobin electrophoresis, if desired.  Both family histories were reviewed and found to be contributory for the patient's first child, a daughter, with meconium stained  amniotic fluid at delivery and subsequent brain damage and seizures after delivery reportedly due to oxygen deprivation at birth. She reportedly died at age 55 and a half months. She also reported a full term fetal demise, a son, in 2009. In the case of an environmental cause for her daughter's features, recurrence risk for relatives would not be expected to be increased. However, in the case of a different underlying cause, recurrence risk assessment may be altered.   The patient reported a female maternal first cousin once removed with autism. An underlying etiology was not known. We discussed that autism is part of the spectrum of conditions referred to as Autistic spectrum disorders (ASD).  We discussed that ASDs are among the most common neurodevelopmental disorders, with approximately 1 in 88 children meeting criteria for ASD.  Approximately 80% of individuals diagnosed are female. There is strong evidence that genetic factors play a critical role in development of ASD.  There have been recent advances in identifying specific genetic causes of ASD, however, there are still many individuals for whom the etiology of the ASD is not known.  Once a family has a child with a diagnosis of ASD, there is a 13.5% chance to have another child with ASD.  Given the reported family history and degree of relation, recurrence risk to the current pregnancy would not likely be increased above the general population risk, in the case of multifactorial inheritance. They understand that at this time there is not genetic testing available for ASD for most families.    Ms. Geremia was not familiar with the father of the baby's family history.  We, therefore, cannot comment on how his history might contribute to the overall chance for the baby to have a birth defect.  Without further information regarding the provided family history, an accurate genetic risk cannot be calculated. Further genetic counseling is warranted if more  information is obtained.  Ms. CHESSIE NEUHARTH denied exposure to environmental toxins or chemical agents. She denied the use of alcohol, tobacco or street drugs. She denied significant viral illnesses during the course of her pregnancy. Her medical and surgical histories were contributory for previous stillbirth, as previously discussed. Ms. Hruska was seen for Maternal Fetal Medicine consultation to discuss pregnancy management given her previous pregnancy history. See separate MFM consult note for detailed discussion.      I counseled this couple regarding the above risks and available options. The approximate face-to-face time with the genetic counselor was 45 minutes.    Quinn Plowman, MS,  Certified Genetic Counselor 04/05/2012

## 2012-04-09 ENCOUNTER — Encounter (HOSPITAL_COMMUNITY): Payer: Self-pay | Admitting: MS"

## 2012-05-17 ENCOUNTER — Encounter (HOSPITAL_COMMUNITY): Payer: Self-pay

## 2012-05-17 ENCOUNTER — Ambulatory Visit (HOSPITAL_COMMUNITY)
Admission: RE | Admit: 2012-05-17 | Discharge: 2012-05-17 | Disposition: A | Discharge status: Home / Self Care | Payer: Medicaid Other | Source: Ambulatory Visit | Attending: Obstetrics | Admitting: Obstetrics

## 2012-05-17 VITALS — BP 130/67 | HR 92 | Wt 206.0 lb

## 2012-05-17 DIAGNOSIS — O09299 Supervision of pregnancy with other poor reproductive or obstetric history, unspecified trimester: Secondary | ICD-10-CM | POA: Insufficient documentation

## 2012-05-17 DIAGNOSIS — O34219 Maternal care for unspecified type scar from previous cesarean delivery: Secondary | ICD-10-CM | POA: Insufficient documentation

## 2012-05-17 DIAGNOSIS — Z3689 Encounter for other specified antenatal screening: Secondary | ICD-10-CM

## 2012-05-17 DIAGNOSIS — O09529 Supervision of elderly multigravida, unspecified trimester: Secondary | ICD-10-CM | POA: Insufficient documentation

## 2012-05-17 DIAGNOSIS — O289 Unspecified abnormal findings on antenatal screening of mother: Secondary | ICD-10-CM | POA: Insufficient documentation

## 2012-05-17 NOTE — Progress Notes (Signed)
Danielle [name]  was seen today for an ultrasound appointment.  See full report in [facility].  Single IUP at 24 2/7 weeks Interval growth is appropriate (62nd %tile) Normal interval anatomy Subjectively increased amniotic fluid volume  Recommend follow-up ultrasound examination in 4 weeks for growth and amniotic fluid reassessment.  Danielle Gula, MD

## 2012-06-14 ENCOUNTER — Ambulatory Visit (HOSPITAL_COMMUNITY)
Admission: RE | Admit: 2012-06-14 | Discharge: 2012-06-14 | Disposition: A | Discharge status: Home / Self Care | Payer: Medicaid Other | Source: Ambulatory Visit | Attending: Obstetrics | Admitting: Obstetrics

## 2012-06-14 DIAGNOSIS — O09529 Supervision of elderly multigravida, unspecified trimester: Secondary | ICD-10-CM | POA: Insufficient documentation

## 2012-06-14 DIAGNOSIS — O409XX0 Polyhydramnios, unspecified trimester, not applicable or unspecified: Secondary | ICD-10-CM | POA: Insufficient documentation

## 2012-06-14 DIAGNOSIS — O34219 Maternal care for unspecified type scar from previous cesarean delivery: Secondary | ICD-10-CM | POA: Insufficient documentation

## 2012-06-14 DIAGNOSIS — O09299 Supervision of pregnancy with other poor reproductive or obstetric history, unspecified trimester: Secondary | ICD-10-CM | POA: Insufficient documentation

## 2012-06-14 DIAGNOSIS — Z3689 Encounter for other specified antenatal screening: Secondary | ICD-10-CM

## 2012-06-14 DIAGNOSIS — O289 Unspecified abnormal findings on antenatal screening of mother: Secondary | ICD-10-CM | POA: Insufficient documentation

## 2012-06-14 NOTE — ED Notes (Signed)
Patient describes increased normal vaginal discharge, clear in color. Does not feel water is broken. Positive fetal movement noted

## 2012-07-10 ENCOUNTER — Other Ambulatory Visit (HOSPITAL_COMMUNITY): Payer: Self-pay | Admitting: Obstetrics

## 2012-07-10 DIAGNOSIS — O09529 Supervision of elderly multigravida, unspecified trimester: Secondary | ICD-10-CM

## 2012-07-12 ENCOUNTER — Ambulatory Visit (HOSPITAL_COMMUNITY)
Admission: RE | Admit: 2012-07-12 | Discharge: 2012-07-12 | Disposition: A | Discharge status: Home / Self Care | Payer: Medicaid Other | Source: Ambulatory Visit | Attending: Obstetrics | Admitting: Obstetrics

## 2012-07-12 VITALS — BP 113/67 | HR 92 | Wt 216.5 lb

## 2012-07-12 DIAGNOSIS — O409XX0 Polyhydramnios, unspecified trimester, not applicable or unspecified: Secondary | ICD-10-CM | POA: Insufficient documentation

## 2012-07-12 DIAGNOSIS — O34219 Maternal care for unspecified type scar from previous cesarean delivery: Secondary | ICD-10-CM | POA: Insufficient documentation

## 2012-07-12 DIAGNOSIS — O09299 Supervision of pregnancy with other poor reproductive or obstetric history, unspecified trimester: Secondary | ICD-10-CM | POA: Insufficient documentation

## 2012-07-12 DIAGNOSIS — O09529 Supervision of elderly multigravida, unspecified trimester: Secondary | ICD-10-CM | POA: Insufficient documentation

## 2012-07-12 DIAGNOSIS — O289 Unspecified abnormal findings on antenatal screening of mother: Secondary | ICD-10-CM | POA: Insufficient documentation

## 2012-07-12 NOTE — Progress Notes (Signed)
[facility] ultrasound  Indication: 42 yr old [id number] at [redacted]w[redacted]d with history of full term fetal demise and previous finding of polyhydramnios for follow up fetal growth ultrasound.  Findings: 1. Single intrauterine pregnancy. 2. Estimated fetal weight is in the 75th%. 3. Posterior placenta without evidence of previa. 4. Normal amniotic fluid index; although slightly increased for gestational age. 5. The limited anatomy survey is normal. The bowel appeared mildly dilated on today's exam.  Recommendations: 1. Appropriate fetal growth. 2. Previous finding of polyhydramnios: - previously counseled - amniotic fluid index normal - recommend continue antenatal testing with twice weekly nonstress tests and weekly amniotic fluid index 3. Advanced maternal age: - previously counseled - had quad screen which showed risk of trisomy 14 of 1:127 which is screen positive but improved over her age related risk - patient declined cell free fetal DNA or amniocentesis - recommend continue antenatal testing  - recommend delivery by estimated due date 4. History of fetal demise: - previously counseled - I am not aware of the etiology or what work up has been done I recommend fetal kick counts- instructions were given and patient is to call with decreased fetal movement. I do not recommend early delivery based on this history. Recommend delivery at 39-[redacted] weeks gestation in the absence of other complications. If patient has significant anxiety she can be delivered after 37 weeks with an amniocentesis showing fetal lung maturity. 5. Recommend ultrasound for fetal growth in 4 weeks. Will reevaluate fetal bowel at that time.  Eulis Foster, MD

## 2012-07-30 ENCOUNTER — Other Ambulatory Visit: Payer: Self-pay | Admitting: Obstetrics & Gynecology

## 2012-07-30 ENCOUNTER — Encounter (HOSPITAL_COMMUNITY): Payer: Self-pay | Admitting: Pharmacist

## 2012-08-02 LAB — OB RESULTS CONSOLE GBS: GBS: POSITIVE

## 2012-08-07 ENCOUNTER — Ambulatory Visit: Payer: Medicaid Other | Admitting: Obstetrics & Gynecology

## 2012-08-07 DIAGNOSIS — O0990 Supervision of high risk pregnancy, unspecified, unspecified trimester: Secondary | ICD-10-CM

## 2012-08-07 DIAGNOSIS — O09529 Supervision of elderly multigravida, unspecified trimester: Secondary | ICD-10-CM

## 2012-08-07 DIAGNOSIS — Z3480 Encounter for supervision of other normal pregnancy, unspecified trimester: Secondary | ICD-10-CM

## 2012-08-07 LAB — POCT URINALYSIS DIPSTICK
Ketones, UA: NEGATIVE
Spec Grav, UA: 1.025
Urobilinogen, UA: NEGATIVE
pH, UA: 5

## 2012-08-09 ENCOUNTER — Ambulatory Visit (HOSPITAL_COMMUNITY)
Admission: RE | Admit: 2012-08-09 | Discharge: 2012-08-09 | Disposition: A | Discharge status: Home / Self Care | Payer: Medicaid Other | Source: Ambulatory Visit | Attending: Obstetrics & Gynecology | Admitting: Obstetrics & Gynecology

## 2012-08-09 DIAGNOSIS — O09299 Supervision of pregnancy with other poor reproductive or obstetric history, unspecified trimester: Secondary | ICD-10-CM | POA: Insufficient documentation

## 2012-08-09 DIAGNOSIS — O34219 Maternal care for unspecified type scar from previous cesarean delivery: Secondary | ICD-10-CM | POA: Insufficient documentation

## 2012-08-09 DIAGNOSIS — O289 Unspecified abnormal findings on antenatal screening of mother: Secondary | ICD-10-CM | POA: Insufficient documentation

## 2012-08-09 DIAGNOSIS — O09529 Supervision of elderly multigravida, unspecified trimester: Secondary | ICD-10-CM

## 2012-08-09 DIAGNOSIS — O409XX0 Polyhydramnios, unspecified trimester, not applicable or unspecified: Secondary | ICD-10-CM | POA: Insufficient documentation

## 2012-08-09 NOTE — Progress Notes (Signed)
Danielle [name]  was seen today for an ultrasound appointment.  See full report in [facility].  Impression: Single IUP at 36 2/7 week Follow up due to hx of term IUFD and mild polhydramnios Interval growth is appropriate (81st %tile) Normal interval anatomy Subjectively increased amniotic fluid volume  Recommendations: Follow-up ultrasounds as clinically indicated.   Alpha Gula, MD

## 2012-08-12 NOTE — Addendum Note (Signed)
Encounter addended by: Ty Hilts, RN on: 08/12/2012  8:15 AM<BR>     Documentation filed: Charges VN, OB Prenatal Vitals, Episodes

## 2012-08-13 ENCOUNTER — Other Ambulatory Visit: Payer: Self-pay | Admitting: *Deleted

## 2012-08-13 ENCOUNTER — Other Ambulatory Visit: Payer: Medicaid Other

## 2012-08-13 ENCOUNTER — Ambulatory Visit (INDEPENDENT_AMBULATORY_CARE_PROVIDER_SITE_OTHER): Payer: Medicaid Other | Admitting: Obstetrics & Gynecology

## 2012-08-13 ENCOUNTER — Encounter (HOSPITAL_COMMUNITY)
Admission: RE | Admit: 2012-08-13 | Discharge: 2012-08-13 | Disposition: A | Discharge status: Home / Self Care | Payer: Medicaid Other | Source: Ambulatory Visit | Attending: Obstetrics & Gynecology | Admitting: Obstetrics & Gynecology

## 2012-08-13 ENCOUNTER — Encounter (HOSPITAL_COMMUNITY): Payer: Self-pay

## 2012-08-13 DIAGNOSIS — Z3480 Encounter for supervision of other normal pregnancy, unspecified trimester: Secondary | ICD-10-CM

## 2012-08-13 DIAGNOSIS — O0990 Supervision of high risk pregnancy, unspecified, unspecified trimester: Secondary | ICD-10-CM | POA: Insufficient documentation

## 2012-08-13 DIAGNOSIS — O09529 Supervision of elderly multigravida, unspecified trimester: Secondary | ICD-10-CM

## 2012-08-13 HISTORY — DX: Gastro-esophageal reflux disease without esophagitis: K21.9

## 2012-08-13 HISTORY — DX: Hypothyroidism, unspecified: E03.9

## 2012-08-13 LAB — TYPE AND SCREEN
ABO/RH(D): A POS
Antibody Screen: NEGATIVE

## 2012-08-13 LAB — CBC
HCT: 35.1 % — ABNORMAL LOW (ref 36.0–46.0)
Hemoglobin: 11.8 g/dL — ABNORMAL LOW (ref 12.0–15.0)
MCHC: 33.6 g/dL (ref 30.0–36.0)
WBC: 7.9 10*3/uL (ref 4.0–10.5)

## 2012-08-13 LAB — RPR: RPR Ser Ql: NONREACTIVE

## 2012-08-13 LAB — POCT URINALYSIS DIPSTICK
Bilirubin, UA: NEGATIVE
Blood, UA: NEGATIVE
Ketones, UA: NEGATIVE
Leukocytes, UA: NEGATIVE
Spec Grav, UA: 1.01
pH, UA: 7

## 2012-08-13 MED ORDER — DEXTROSE 5 % IV SOLN: 1.0000 g | Freq: Once | INTRAVENOUS | Status: DC

## 2012-08-13 NOTE — Patient Instructions (Addendum)
20 DEBE ANFINSON  08/13/2012   Your procedure is scheduled on:  08/15/12  Enter through the Main Entrance of [facility] at 1030 AM.  Pick up the phone at the desk and dial [phone].   Call this number if you have problems the morning of surgery: [phone]   Remember:   Do not eat food:After Midnight.  Do not drink clear liquids: After Midnight.  Take these medicines the morning of surgery with A SIP OF WATER: NA   Do not wear jewelry, make-up or nail polish.  Do not wear lotions, powders, or perfumes. You may wear deodorant.  Do not shave 48 hours prior to surgery.  Do not bring valuables to the hospital.  Contacts, dentures or bridgework may not be worn into surgery.  Leave suitcase in the car. After surgery it may be brought to your room.  For patients admitted to the hospital, checkout time is 11:00 AM the day of discharge.   Patients discharged the day of surgery will not be allowed to drive home.  Name and phone number of your driver: NA  Special Instructions: Incentive Spirometry - Practice and bring it with you on the day of surgery. Shower using CHG 2 nights before surgery and the night before surgery.  If you shower the day of surgery use CHG.  Use special wash - you have one bottle of CHG for all showers.  You should use approximately 1/3 of the bottle for each shower.   Please read over the following fact sheets that you were given: Surgical Site Infection Prevention

## 2012-08-13 NOTE — Progress Notes (Signed)
Doing well 

## 2012-08-13 NOTE — Patient Instructions (Signed)
Amniocentesis Amniocentesis (amnio) is the removal of a small amount of fluid that surrounds the baby in the amniotic sac. Once the fluid is removed, it may be examined to find answers to a number of serious questions. An amnio is often done early in pregnancy (between 15 and 17 weeks) to determine if there is a complication with the baby, or it is done later in pregnancy (between 6633 and 37 weeks) to see if the baby's lungs are mature. Amnios that are done later in pregnancy are often done to help weigh the risks and benefits of a needed early delivery. Amnios done early in the second trimester are commonly referred to as genetic amnios, as they are typically done to check for a potential life-altering genetic abnormality. Rarely, an amnio is done to evaluate the amniotic fluid for concerns of infection. Amniocentesis may be done for other reasons, including:   If the mother is 42 years old or older. This is because of the increased risk of chromosome abnormalities, such as Down's syndrome or various chromosomal trisomies.  To determine any genetic problems.  To look for signs of infection in the uterus.  To determine if the baby's lungs are mature enough for the baby to survive outside of the uterus. This information is important in pregnancies when the baby must be delivered early. LET YOUR CAREGIVER KNOW ABOUT:  Any complications you have had with the pregnancy, such as bleeding or contractions.  Allergies.  Medicines taken, including vitamins, herbs, eyedrops, over-the-counter medicines, and creams.  Use of steroids (by mouth or creams).  Previous problems with numbing medicines.  History of bleeding problems or blood clots.  Previous surgery.  Other health problems. RISKS AND COMPLICATIONS  Complications can include:  Vaginal bleeding.  Transmission of an infection from mother to baby, such as hepatitis C or other viruses.  Leaking of amniotic fluid.  Premature  labor.  Fetal injury.  Injury to the placenta.  Miscarriage (rare). This procedure is done only if your caregiver feels the information obtained from the procedure justifies the risk. Amnios should not be performed before 15 weeks of pregnancy unless it is absolutely necessary. BEFORE THE PROCEDURE   Ask your caregiver any questions you may have.  Eat as usual.  Drink enough fluids to keep your urine clear or pale yellow.  You may want to arrange for someone to drive you home after the procedure. PROCEDURE  A careful ultrasound is done to evaluate the baby for its position and for where the best pockets of fluid lie. The mother's abdomen is prepped with a solution to prevent infections. Often, a sterile ultrasound probe is used to watch the location of the amniocentesis needle being used. A small spot of the skin may be injected with a numbing medicine. In that location, a longer needle is introduced through the skin and down to the level of the baby. Amniotic fluid is removed into a syringe and sent to the lab for evaluation. AFTER THE PROCEDURE   Ask your caregiver if you need a RhoGam shot.  You will rest for 1 to 2 hours for observation.  Your baby will be placed on a monitor for 1 to 2 hours to see if there are any problems.  You may develop cramping and a small amount of vaginal bleeding right after the amnio.  Ask when your test results will be ready. Make sure you get your test results. Document Released: 05/05/2000 Document Revised: 07/31/2011 Document Reviewed: 03/13/2011 [employer] Patient Information  [address], [place].

## 2012-08-14 ENCOUNTER — Encounter (HOSPITAL_COMMUNITY): Payer: Self-pay | Admitting: *Deleted

## 2012-08-14 ENCOUNTER — Inpatient Hospital Stay (HOSPITAL_COMMUNITY)
Admission: AD | Admit: 2012-08-14 | Discharge: 2012-08-14 | Disposition: A | Discharge status: Home / Self Care | Payer: Medicaid Other | Source: Ambulatory Visit | Attending: Obstetrics & Gynecology | Admitting: Obstetrics & Gynecology

## 2012-08-14 ENCOUNTER — Ambulatory Visit (HOSPITAL_COMMUNITY)
Admission: RE | Admit: 2012-08-14 | Discharge: 2012-08-14 | Disposition: A | Discharge status: Home / Self Care | Payer: Medicaid Other | Source: Ambulatory Visit | Attending: Obstetrics & Gynecology | Admitting: Obstetrics & Gynecology

## 2012-08-14 DIAGNOSIS — O409XX0 Polyhydramnios, unspecified trimester, not applicable or unspecified: Secondary | ICD-10-CM | POA: Insufficient documentation

## 2012-08-14 DIAGNOSIS — O09529 Supervision of elderly multigravida, unspecified trimester: Secondary | ICD-10-CM | POA: Insufficient documentation

## 2012-08-14 DIAGNOSIS — O0990 Supervision of high risk pregnancy, unspecified, unspecified trimester: Secondary | ICD-10-CM | POA: Insufficient documentation

## 2012-08-14 DIAGNOSIS — O34219 Maternal care for unspecified type scar from previous cesarean delivery: Secondary | ICD-10-CM | POA: Insufficient documentation

## 2012-08-14 DIAGNOSIS — O289 Unspecified abnormal findings on antenatal screening of mother: Secondary | ICD-10-CM | POA: Insufficient documentation

## 2012-08-14 DIAGNOSIS — O09299 Supervision of pregnancy with other poor reproductive or obstetric history, unspecified trimester: Secondary | ICD-10-CM | POA: Insufficient documentation

## 2012-08-14 NOTE — MAU Note (Signed)
Patient states she was sent to MAU for a NST post amnio. Patient is scheduled for a repeat cesarean section 3-27 pending the results of today's testing.

## 2012-08-14 NOTE — MAU Note (Signed)
Dr. Tamela Oddi notified of pt reactive NST.  Orders for discharge rec'd.

## 2012-08-14 NOTE — Procedures (Signed)
A timeout was performed confirming the patient procedure and allergy status. The abdomen was prepped and draped. An amniocentesis was performed under ultrasound guidance. 20 cc of clear fluid was aspirated. The fetal heart rate postprocedure was within normal limits.

## 2012-08-15 ENCOUNTER — Encounter: Payer: Self-pay | Admitting: Obstetrics & Gynecology

## 2012-08-15 LAB — LSPG (L/S RATIO WITH PG)-AMNIO FLUID

## 2012-08-15 NOTE — Patient Instructions (Signed)
Amniocentesis Care After These instructions give you information on caring for yourself after your procedure. Your doctor may also give you more specific instructions. Call your doctor if you have any problems or questions after your procedure. HOME CARE  Take all medicine as told by your doctor.  Eat as you normally do.  Drink enough fluids to keep your pee (urine) clear or pale yellow.  Avoid having sex (intercourse) until your doctor says it is okay.  Do not lift anything heavy for the rest of the day. GET HELP RIGHT AWAY IF:   You are bleeding from your vagina and it will not stop.  You have very bad cramps that do not go away.  You have fluid coming from your vagina.  You have a fever.  You do not feel the baby moving like normal. MAKE SURE YOU:  Understand these instructions.  Will watch your condition.  Will get help right away if you are not doing well or get worse. Document Released: 06/10/2010 Document Revised: 07/31/2011 Document Reviewed: 03/13/2011 [facility] Patient Information 2013 [place], [place].

## 2012-08-15 NOTE — Progress Notes (Signed)
Subjective:    Danielle [name] is a 42 y.o. female being seen today for her obstetrical visit. She is at [redacted]w[redacted]d gestation. Patient reports no complaints. Fetal movement: normal.  Menstrual History: OB History   Grav Para Term Preterm Abortions TAB SAB Ect Mult Living   8 3 3  4 2 1 1  0 1       The following portions of the patient's history were reviewed and updated as appropriate: allergies, current medications, past family history, past medical history, past social history, past surgical history and problem list.  Review of Systems Pertinent items are noted in HPI.   Objective:    LMP 11/29/2011 FHT: 140 BPM  Uterine Size: size equals dates  Presentations: cephalic                            Assessment:    Pregnancy @[redacted]w[redacted]d ; poor OB history NST reactive  Plan:   Plans for delivery: amnio for FLM--> C/D Beta strep culture: done Counseling: Consent signed. Fetal testing: discussed

## 2012-08-16 ENCOUNTER — Ambulatory Visit (HOSPITAL_COMMUNITY): Payer: Medicaid Other

## 2012-08-19 ENCOUNTER — Ambulatory Visit (INDEPENDENT_AMBULATORY_CARE_PROVIDER_SITE_OTHER): Payer: Medicaid Other | Admitting: Obstetrics & Gynecology

## 2012-08-19 ENCOUNTER — Other Ambulatory Visit (HOSPITAL_COMMUNITY): Payer: Medicaid Other

## 2012-08-19 ENCOUNTER — Other Ambulatory Visit: Payer: Medicaid Other

## 2012-08-19 VITALS — BP 136/82 | Temp 96.7°F | Wt 217.0 lb

## 2012-08-19 DIAGNOSIS — O0990 Supervision of high risk pregnancy, unspecified, unspecified trimester: Secondary | ICD-10-CM

## 2012-08-19 DIAGNOSIS — O09529 Supervision of elderly multigravida, unspecified trimester: Secondary | ICD-10-CM

## 2012-08-19 LAB — POCT URINALYSIS DIPSTICK
Bilirubin, UA: NEGATIVE
Blood, UA: NEGATIVE
Ketones, UA: NEGATIVE
pH, UA: 5

## 2012-08-19 NOTE — Patient Instructions (Addendum)
Cesarean Delivery  Cesarean delivery is the birth of a baby through a cut (incision) in the abdomen and womb (uterus).  LET YOUR CAREGIVER KNOW ABOUT:  Complicationsinvolving the pregnancy.  Allergies.  Medicines taken including herbs, eyedrops, over-the-counter medicines, and creams.  Use of steroids (by mouth or creams).  Previous problems with anesthetics or numbing medicine.  Previous surgery.  History of blood clots.  History of bleeding or blood problems.  Other health problems. RISKS AND COMPLICATIONS   Bleeding.  Infection.  Blood clots.  Injury to surrounding organs.  Anesthesia problems.  Injury to the baby. BEFORE THE PROCEDURE   A tube (Foley catheter) will be placed in your bladder. The Foley catheter drains the urine from your bladder into a bag. This keeps your bladder empty during surgery.  An intravenous access tube (IV) will be placed in your arm.  Hair may be removed from your pubic area and your lower abdomen. This is to prevent infection in the incision site.  You may be given an antacid medicine to drink. This will prevent acid contents in your stomach from going into your lungs if you vomit during the surgery.  You may be given an antibiotic medicine to prevent infection. PROCEDURE   You may be given medicine to numb the lower half of your body (regional anesthetic). If you were in labor, you may have already had an epidural in place which can be used in both labor and cesarean delivery. You may possibly be given medicine to make you sleep (general anesthetic) though this is not as common.  An incision will be made in your abdomen that extends to your uterus. There are 2 basic kinds of incisions:  The horizontal (transverse) incision. Horizontal incisions are used for most routine cesarean deliveries.  The vertical (up and down) incision. This is less commonly used. This is most often reserved for women who have a serious complication  (extreme prematurity) or under emergency situations.  The horizontal and vertical incisions may both be used at the same time. However, this is very uncommon.  Your baby will then be delivered. AFTER THE PROCEDURE   If you were awake during the surgery, you will see your baby right away. If you were asleep, you will see your baby as soon as you are awake.  You may breastfeed your baby after surgery.  You may be able to get up and walk the same day as the surgery. If you need to stay in bed for a period of time, you will receive help to turn, cough, and take deep breaths after surgery. This helps prevent lung problems such as pneumonia.  Do not get out of bed alone the first time after surgery. You will need help getting out of bed until you are able to do this by yourself.  You may be able to shower the day after your cesarean delivery. After the bandage (dressing) is taken off the incision site, a nurse will assist you to shower, if you like.  You will have pneumatic compressing hose placed on your feet or lower legs. These hose are used to prevent blood clots. When you are up and walking regularly, they will no longer be necessary.  Do not cross your legs when you sit.  Save any blood clots that you pass. If you pass a clot while on the toilet, do not flush it. Call for the nurse. Tell the nurse if you think you are bleeding too much or passing too many   clots.  Start drinking liquids and eating food as directed by your caregiver. If your stomach is not ready, drinking and eating too soon can cause an increase in bloating and swelling of your intestine and abdomen. This is very uncomfortable.  You will be given medicine as needed. Let your caregivers know if you are hurting. They want you to be comfortable. You may also be given an antibiotic to prevent an infection.  Your IV will be taken out when you are drinking a reasonable amount of fluids. The Foley catheter is taken out when  you are up and walking.  If your blood type is Rh negative and your baby's blood type is Rh positive, you will be given a shot of anti-D immune globulin. This shot prevents you from having Rh problems with a future pregnancy. You should get the shot even if you had your tubes tied (tubal ligation).  If you are allowed to take the baby for a walk, place the baby in the bassinet and push it. Do not carry your baby in your arms. Document Released: 05/08/2005 Document Revised: 07/31/2011 Document Reviewed: 09/02/2010 [employer] Patient Information 2013 [employer].  

## 2012-08-19 NOTE — Progress Notes (Signed)
Pulse: 92

## 2012-08-20 ENCOUNTER — Encounter: Payer: Self-pay | Admitting: Obstetrics & Gynecology

## 2012-08-20 ENCOUNTER — Other Ambulatory Visit (HOSPITAL_COMMUNITY): Payer: Medicaid Other

## 2012-08-20 NOTE — Progress Notes (Signed)
NST OK. 

## 2012-08-21 ENCOUNTER — Ambulatory Visit: Payer: Medicaid Other | Admitting: Obstetrics & Gynecology

## 2012-08-21 NOTE — H&P (Signed)
Danielle Danielle [name] Danielle [name] is Danielle [name] 42 y.o. female presenting for an elective repeat C/D. Maternal Medical History:  Reason for admission: For an elective repeat C/D.  Complicated OB history.    OB History   Grav Para Term Preterm Abortions TAB SAB Ect Mult Living   8 3 3  4 2 1 1  0 1     Past Medical History  Diagnosis Date  . Thyroid disease     hyper and hypo  . Hyperthyroidism   . Hypothyroidism   . GERD (gastroesophageal reflux disease)    Past Surgical History  Procedure Laterality Date  . Cesarean section    . Dilation and curettage of uterus     Family History: family history includes Cancer in her father; Diabetes in her father; and Hypertension in her mother. Social History:  reports that she has quit smoking. She does not have any smokeless tobacco history on file. She reports that she does not drink alcohol or use illicit drugs.     Review of Systems  Constitutional: Negative for fever.  Eyes: Negative for blurred vision.  Respiratory: Negative for shortness of breath.   Gastrointestinal: Negative for vomiting.  Skin: Negative for rash.  Neurological: Negative for headaches.      Last menstrual period 11/29/2011. Maternal Exam:  Abdomen: Patient reports no abdominal tenderness. Fundal height is term.   Fetal presentation: vertex  Introitus: not evaluated.     Fetal Exam Fetal Monitor Review: Variability: moderate (6-25 bpm).   Pattern: accelerations present and no decelerations.    Fetal State Assessment: Category I - tracings are normal.     Physical Exam  Constitutional: She appears well-developed.  HENT:  Head: Normocephalic.  Neck: Neck supple. No thyromegaly present.  Cardiovascular: Normal rate and regular rhythm.   Respiratory: Breath sounds normal.  GI: Soft. Bowel sounds are normal.  Skin: No rash noted.    Prenatal labs: ABO, Rh: --/--/Danielle [name] POS (03/25 1210) Antibody: NEG (03/25 1210) Rubella:   RPR: NON REACTIVE (03/25 1210)  HBsAg:     HIV:    GBS:     Assessment/Plan: Multipara at [redacted]w[redacted]d.    H/O previous IUFD at term/C/D.  Declines TOLAC.  Admit Repeat C/D  Danielle Danielle [name] Danielle [name],Danielle Danielle [name] Danielle [name] 08/21/2012, 5:33 PM

## 2012-08-22 ENCOUNTER — Inpatient Hospital Stay (HOSPITAL_COMMUNITY)
Admission: RE | Admit: 2012-08-22 | Discharge: 2012-08-25 | DRG: 765 | Disposition: A | Discharge status: Home / Self Care | Payer: Medicaid Other | Source: Ambulatory Visit | Attending: Obstetrics & Gynecology | Admitting: Obstetrics & Gynecology

## 2012-08-22 ENCOUNTER — Encounter (HOSPITAL_COMMUNITY)
Admission: RE | Disposition: A | Discharge status: Home / Self Care | Payer: Self-pay | Source: Ambulatory Visit | Attending: Obstetrics & Gynecology

## 2012-08-22 ENCOUNTER — Inpatient Hospital Stay (HOSPITAL_COMMUNITY): Payer: Medicaid Other | Admitting: Anesthesiology

## 2012-08-22 ENCOUNTER — Encounter (HOSPITAL_COMMUNITY): Payer: Self-pay | Admitting: Anesthesiology

## 2012-08-22 ENCOUNTER — Encounter (HOSPITAL_COMMUNITY): Payer: Self-pay | Admitting: *Deleted

## 2012-08-22 DIAGNOSIS — Z98891 History of uterine scar from previous surgery: Secondary | ICD-10-CM

## 2012-08-22 DIAGNOSIS — Z302 Encounter for sterilization: Secondary | ICD-10-CM

## 2012-08-22 DIAGNOSIS — O09299 Supervision of pregnancy with other poor reproductive or obstetric history, unspecified trimester: Secondary | ICD-10-CM

## 2012-08-22 DIAGNOSIS — O409XX0 Polyhydramnios, unspecified trimester, not applicable or unspecified: Secondary | ICD-10-CM | POA: Diagnosis present

## 2012-08-22 DIAGNOSIS — O34219 Maternal care for unspecified type scar from previous cesarean delivery: Principal | ICD-10-CM | POA: Diagnosis present

## 2012-08-22 DIAGNOSIS — E079 Disorder of thyroid, unspecified: Secondary | ICD-10-CM | POA: Diagnosis present

## 2012-08-22 DIAGNOSIS — E0590 Thyrotoxicosis, unspecified without thyrotoxic crisis or storm: Secondary | ICD-10-CM | POA: Diagnosis present

## 2012-08-22 DIAGNOSIS — E039 Hypothyroidism, unspecified: Secondary | ICD-10-CM | POA: Diagnosis present

## 2012-08-22 DIAGNOSIS — O09529 Supervision of elderly multigravida, unspecified trimester: Secondary | ICD-10-CM | POA: Diagnosis present

## 2012-08-22 DIAGNOSIS — O99284 Endocrine, nutritional and metabolic diseases complicating childbirth: Secondary | ICD-10-CM | POA: Diagnosis present

## 2012-08-22 DIAGNOSIS — O0990 Supervision of high risk pregnancy, unspecified, unspecified trimester: Secondary | ICD-10-CM

## 2012-08-22 HISTORY — PX: TUBAL LIGATION: SHX77

## 2012-08-22 LAB — CBC
HCT: 35.7 % — ABNORMAL LOW (ref 36.0–46.0)
Hemoglobin: 11.9 g/dL — ABNORMAL LOW (ref 12.0–15.0)
MCHC: 33.3 g/dL (ref 30.0–36.0)
RBC: 4.3 MIL/uL (ref 3.87–5.11)

## 2012-08-22 LAB — TYPE AND SCREEN
ABO/RH(D): A POS
Antibody Screen: NEGATIVE

## 2012-08-22 SURGERY — LIGATION, FALLOPIAN TUBE, BILATERAL
Anesthesia: Spinal | Site: Abdomen | Wound class: Clean Contaminated

## 2012-08-22 MED ORDER — DIPHENHYDRAMINE HCL 50 MG/ML IJ SOLN: 25.0000 mg | INTRAMUSCULAR | Status: DC | PRN

## 2012-08-22 MED ORDER — DEXTROSE 5 % IV SOLN
500.0000 mg | Freq: Once | INTRAVENOUS | Status: AC
  Administered 2012-08-22: 500 mg via INTRAVENOUS
  Filled 2012-08-22: qty 500

## 2012-08-22 MED ORDER — PHENYLEPHRINE HCL 10 MG/ML IJ SOLN
INTRAMUSCULAR | Status: DC | PRN
  Administered 2012-08-22 (×2): 80 ug via INTRAVENOUS
  Administered 2012-08-22 (×2): 40 ug via INTRAVENOUS

## 2012-08-22 MED ORDER — KETOROLAC TROMETHAMINE 60 MG/2ML IM SOLN
INTRAMUSCULAR | Status: AC
  Administered 2012-08-22: 60 mg via INTRAMUSCULAR
  Filled 2012-08-22: qty 2

## 2012-08-22 MED ORDER — SCOPOLAMINE 1 MG/3DAYS TD PT72
MEDICATED_PATCH | TRANSDERMAL | Status: AC
  Administered 2012-08-22: 1.5 mg via TRANSDERMAL
  Filled 2012-08-22: qty 1

## 2012-08-22 MED ORDER — BUPIVACAINE IN DEXTROSE 0.75-8.25 % IT SOLN
INTRATHECAL | Status: DC | PRN
  Administered 2012-08-22: 1.4 mL via INTRATHECAL

## 2012-08-22 MED ORDER — NALBUPHINE HCL 10 MG/ML IJ SOLN
5.0000 mg | INTRAMUSCULAR | Status: DC | PRN
  Administered 2012-08-22: 10 mg via INTRAVENOUS
  Administered 2012-08-23: 5 mg via INTRAVENOUS
  Administered 2012-08-23: 10 mg via INTRAVENOUS
  Filled 2012-08-22 (×3): qty 1

## 2012-08-22 MED ORDER — FENTANYL CITRATE 0.05 MG/ML IJ SOLN
INTRAMUSCULAR | Status: AC
  Filled 2012-08-22: qty 2

## 2012-08-22 MED ORDER — MORPHINE SULFATE 0.5 MG/ML IJ SOLN
INTRAMUSCULAR | Status: AC
  Filled 2012-08-22: qty 10

## 2012-08-22 MED ORDER — KETOROLAC TROMETHAMINE 30 MG/ML IJ SOLN: 30.0000 mg | Freq: Four times a day (QID) | INTRAMUSCULAR | Status: AC | PRN

## 2012-08-22 MED ORDER — HYDROMORPHONE HCL PF 1 MG/ML IJ SOLN
INTRAMUSCULAR | Status: AC
  Administered 2012-08-22: 0.5 mg via INTRAVENOUS
  Filled 2012-08-22: qty 1

## 2012-08-22 MED ORDER — SIMETHICONE 80 MG PO CHEW
80.0000 mg | CHEWABLE_TABLET | Freq: Three times a day (TID) | ORAL | Status: DC
  Administered 2012-08-22 – 2012-08-24 (×8): 80 mg via ORAL

## 2012-08-22 MED ORDER — ZOLPIDEM TARTRATE 5 MG PO TABS: 5.0000 mg | ORAL_TABLET | Freq: Every evening | ORAL | Status: DC | PRN

## 2012-08-22 MED ORDER — DIBUCAINE 1 % RE OINT: 1.0000 "application " | TOPICAL_OINTMENT | RECTAL | Status: DC | PRN

## 2012-08-22 MED ORDER — NALOXONE HCL 1 MG/ML IJ SOLN
1.0000 ug/kg/h | INTRAVENOUS | Status: DC | PRN
  Filled 2012-08-22: qty 2

## 2012-08-22 MED ORDER — LANOLIN HYDROUS EX OINT: 1.0000 "application " | TOPICAL_OINTMENT | CUTANEOUS | Status: DC | PRN

## 2012-08-22 MED ORDER — CEFAZOLIN SODIUM-DEXTROSE 2-3 GM-% IV SOLR
INTRAVENOUS | Status: AC
  Filled 2012-08-22: qty 50

## 2012-08-22 MED ORDER — CEFAZOLIN SODIUM-DEXTROSE 2-3 GM-% IV SOLR
2.0000 g | INTRAVENOUS | Status: AC
  Administered 2012-08-22: 2 g via INTRAVENOUS

## 2012-08-22 MED ORDER — MEPERIDINE HCL 25 MG/ML IJ SOLN: 6.2500 mg | INTRAMUSCULAR | Status: DC | PRN

## 2012-08-22 MED ORDER — IBUPROFEN 600 MG PO TABS
600.0000 mg | ORAL_TABLET | Freq: Four times a day (QID) | ORAL | Status: DC
  Administered 2012-08-22 – 2012-08-25 (×11): 600 mg via ORAL
  Filled 2012-08-22 (×12): qty 1

## 2012-08-22 MED ORDER — PROMETHAZINE HCL 25 MG/ML IJ SOLN: 6.2500 mg | INTRAMUSCULAR | Status: DC | PRN

## 2012-08-22 MED ORDER — TETANUS-DIPHTH-ACELL PERTUSSIS 5-2.5-18.5 LF-MCG/0.5 IM SUSP: 0.5000 mL | Freq: Once | INTRAMUSCULAR | Status: DC

## 2012-08-22 MED ORDER — PRENATAL MULTIVITAMIN CH
1.0000 | ORAL_TABLET | Freq: Every day | ORAL | Status: DC
  Administered 2012-08-23 – 2012-08-25 (×3): 1 via ORAL
  Filled 2012-08-22 (×3): qty 1

## 2012-08-22 MED ORDER — DIPHENHYDRAMINE HCL 25 MG PO CAPS
25.0000 mg | ORAL_CAPSULE | ORAL | Status: DC | PRN
  Administered 2012-08-24: 25 mg via ORAL
  Filled 2012-08-22 (×2): qty 1

## 2012-08-22 MED ORDER — LACTATED RINGERS IV SOLN
INTRAVENOUS | Status: DC
  Administered 2012-08-22 – 2012-08-23 (×2): via INTRAVENOUS

## 2012-08-22 MED ORDER — METOCLOPRAMIDE HCL 5 MG/ML IJ SOLN: 10.0000 mg | Freq: Three times a day (TID) | INTRAMUSCULAR | Status: DC | PRN

## 2012-08-22 MED ORDER — SCOPOLAMINE 1 MG/3DAYS TD PT72: 1.0000 | MEDICATED_PATCH | Freq: Once | TRANSDERMAL | Status: DC

## 2012-08-22 MED ORDER — ONDANSETRON HCL 4 MG/2ML IJ SOLN: 4.0000 mg | Freq: Three times a day (TID) | INTRAMUSCULAR | Status: DC | PRN

## 2012-08-22 MED ORDER — FENTANYL CITRATE 0.05 MG/ML IJ SOLN
INTRAMUSCULAR | Status: DC | PRN
  Administered 2012-08-22: 12.5 ug via INTRATHECAL

## 2012-08-22 MED ORDER — OXYTOCIN 40 UNITS IN LACTATED RINGERS INFUSION - SIMPLE MED
INTRAVENOUS | Status: DC | PRN
  Administered 2012-08-22: 40 [IU] via INTRAVENOUS

## 2012-08-22 MED ORDER — WITCH HAZEL-GLYCERIN EX PADS: 1.0000 "application " | MEDICATED_PAD | CUTANEOUS | Status: DC | PRN

## 2012-08-22 MED ORDER — LACTATED RINGERS IV SOLN
INTRAVENOUS | Status: DC | PRN
  Administered 2012-08-22: 16:00:00 via INTRAVENOUS

## 2012-08-22 MED ORDER — HYDROMORPHONE HCL PF 1 MG/ML IJ SOLN
0.2500 mg | INTRAMUSCULAR | Status: DC | PRN
  Administered 2012-08-22: 0.5 mg via INTRAVENOUS

## 2012-08-22 MED ORDER — OXYTOCIN 10 UNIT/ML IJ SOLN
INTRAMUSCULAR | Status: AC
  Filled 2012-08-22: qty 4

## 2012-08-22 MED ORDER — NALBUPHINE HCL 10 MG/ML IJ SOLN: 5.0000 mg | INTRAMUSCULAR | Status: DC | PRN

## 2012-08-22 MED ORDER — SENNOSIDES-DOCUSATE SODIUM 8.6-50 MG PO TABS
2.0000 | ORAL_TABLET | Freq: Every day | ORAL | Status: DC
  Administered 2012-08-23 – 2012-08-24 (×2): 2 via ORAL

## 2012-08-22 MED ORDER — DIPHENHYDRAMINE HCL 50 MG/ML IJ SOLN: 12.5000 mg | INTRAMUSCULAR | Status: DC | PRN

## 2012-08-22 MED ORDER — PHENYLEPHRINE 40 MCG/ML (10ML) SYRINGE FOR IV PUSH (FOR BLOOD PRESSURE SUPPORT)
PREFILLED_SYRINGE | INTRAVENOUS | Status: AC
  Filled 2012-08-22: qty 5

## 2012-08-22 MED ORDER — LACTATED RINGERS IV SOLN
INTRAVENOUS | Status: DC
  Administered 2012-08-22 (×3): via INTRAVENOUS

## 2012-08-22 MED ORDER — ONDANSETRON HCL 4 MG/2ML IJ SOLN: 4.0000 mg | INTRAMUSCULAR | Status: DC | PRN

## 2012-08-22 MED ORDER — OXYTOCIN 40 UNITS IN LACTATED RINGERS INFUSION - SIMPLE MED: 62.5000 mL/h | INTRAVENOUS | Status: AC

## 2012-08-22 MED ORDER — LACTATED RINGERS IV SOLN
Freq: Once | INTRAVENOUS | Status: AC
  Administered 2012-08-22: 15:00:00 via INTRAVENOUS

## 2012-08-22 MED ORDER — ONDANSETRON HCL 4 MG/2ML IJ SOLN
INTRAMUSCULAR | Status: AC
  Filled 2012-08-22: qty 2

## 2012-08-22 MED ORDER — MENTHOL 3 MG MT LOZG
1.0000 | LOZENGE | OROMUCOSAL | Status: DC | PRN
  Filled 2012-08-22: qty 9

## 2012-08-22 MED ORDER — DIPHENHYDRAMINE HCL 25 MG PO CAPS
25.0000 mg | ORAL_CAPSULE | Freq: Four times a day (QID) | ORAL | Status: DC | PRN
  Administered 2012-08-23 (×2): 25 mg via ORAL
  Filled 2012-08-22: qty 1

## 2012-08-22 MED ORDER — KETOROLAC TROMETHAMINE 30 MG/ML IJ SOLN: 15.0000 mg | Freq: Once | INTRAMUSCULAR | Status: DC | PRN

## 2012-08-22 MED ORDER — NALOXONE HCL 0.4 MG/ML IJ SOLN: 0.4000 mg | INTRAMUSCULAR | Status: DC | PRN

## 2012-08-22 MED ORDER — ONDANSETRON HCL 4 MG/2ML IJ SOLN
INTRAMUSCULAR | Status: DC | PRN
  Administered 2012-08-22: 4 mg via INTRAVENOUS

## 2012-08-22 MED ORDER — MEPERIDINE HCL 25 MG/ML IJ SOLN
6.2500 mg | INTRAMUSCULAR | Status: DC | PRN
Start: 2012-08-22 — End: 2012-08-22

## 2012-08-22 MED ORDER — ONDANSETRON HCL 4 MG PO TABS: 4.0000 mg | ORAL_TABLET | ORAL | Status: DC | PRN

## 2012-08-22 MED ORDER — MEPERIDINE HCL 25 MG/ML IJ SOLN
INTRAMUSCULAR | Status: DC | PRN
  Administered 2012-08-22: 25 mg via INTRAVENOUS

## 2012-08-22 MED ORDER — MORPHINE SULFATE (PF) 0.5 MG/ML IJ SOLN
INTRAMUSCULAR | Status: DC | PRN
  Administered 2012-08-22: .1 mg via INTRATHECAL

## 2012-08-22 MED ORDER — ACETAMINOPHEN 500 MG PO TABS
1000.0000 mg | ORAL_TABLET | Freq: Once | ORAL | Status: AC
  Administered 2012-08-22: 1000 mg via ORAL
  Filled 2012-08-22: qty 2

## 2012-08-22 MED ORDER — SODIUM CHLORIDE 0.9 % IJ SOLN: 3.0000 mL | INTRAMUSCULAR | Status: DC | PRN

## 2012-08-22 MED ORDER — 0.9 % SODIUM CHLORIDE (POUR BTL) OPTIME
TOPICAL | Status: DC | PRN
  Administered 2012-08-22: 1000 mL

## 2012-08-22 MED ORDER — KETOROLAC TROMETHAMINE 60 MG/2ML IM SOLN: 60.0000 mg | Freq: Once | INTRAMUSCULAR | Status: AC | PRN

## 2012-08-22 MED ORDER — MEPERIDINE HCL 25 MG/ML IJ SOLN
INTRAMUSCULAR | Status: AC
  Filled 2012-08-22: qty 1

## 2012-08-22 MED ORDER — OXYCODONE-ACETAMINOPHEN 5-325 MG PO TABS
1.0000 | ORAL_TABLET | ORAL | Status: DC | PRN
  Administered 2012-08-23 (×3): 1 via ORAL
  Administered 2012-08-24 – 2012-08-25 (×6): 2 via ORAL
  Filled 2012-08-22 (×3): qty 2
  Filled 2012-08-22 (×2): qty 1
  Filled 2012-08-22 (×3): qty 2
  Filled 2012-08-22: qty 1

## 2012-08-22 MED ORDER — EPHEDRINE 5 MG/ML INJ
INTRAVENOUS | Status: AC
  Filled 2012-08-22: qty 10

## 2012-08-22 MED ORDER — SIMETHICONE 80 MG PO CHEW: 80.0000 mg | CHEWABLE_TABLET | ORAL | Status: DC | PRN

## 2012-08-22 SURGICAL SUPPLY — 45 items
APL SKNCLS STERI-STRIP NONHPOA (GAUZE/BANDAGES/DRESSINGS) ×3
BENZOIN TINCTURE PRP APPL 2/3 (GAUZE/BANDAGES/DRESSINGS) ×4 IMPLANT
CANISTER WOUND CARE 500ML ATS (WOUND CARE) IMPLANT
CLOTH BEACON ORANGE TIMEOUT ST (SAFETY) ×4 IMPLANT
CONTAINER PREFILL 10% NBF 15ML (MISCELLANEOUS) ×4 IMPLANT
DRAPE LG THREE QUARTER DISP (DRAPES) ×4 IMPLANT
DRSG OPSITE POSTOP 4X10 (GAUZE/BANDAGES/DRESSINGS) ×4 IMPLANT
DRSG VAC ATS LRG SENSATRAC (GAUZE/BANDAGES/DRESSINGS) IMPLANT
DRSG VAC ATS MED SENSATRAC (GAUZE/BANDAGES/DRESSINGS) IMPLANT
DRSG VAC ATS SM SENSATRAC (GAUZE/BANDAGES/DRESSINGS) IMPLANT
DURAPREP 26ML APPLICATOR (WOUND CARE) ×4 IMPLANT
ELECT REM PT RETURN 9FT ADLT (ELECTROSURGICAL) ×4
ELECTRODE REM PT RTRN 9FT ADLT (ELECTROSURGICAL) ×3 IMPLANT
EXTRACTOR VACUUM M CUP 4 TUBE (SUCTIONS) IMPLANT
GAUZE SPONGE 4X4 12PLY STRL LF (GAUZE/BANDAGES/DRESSINGS) ×2 IMPLANT
GLOVE BIO SURGEON STRL SZ 6.5 (GLOVE) ×4 IMPLANT
GOWN STRL REIN XL XLG (GOWN DISPOSABLE) ×8 IMPLANT
KIT ABG SYR 3ML LUER SLIP (SYRINGE) IMPLANT
NDL HYPO 25X5/8 SAFETYGLIDE (NEEDLE) ×1 IMPLANT
NEEDLE HYPO 25X5/8 SAFETYGLIDE (NEEDLE) IMPLANT
NS IRRIG 1000ML POUR BTL (IV SOLUTION) ×4 IMPLANT
PACK C SECTION WH (CUSTOM PROCEDURE TRAY) ×4 IMPLANT
PAD ABD 7.5X8 STRL (GAUZE/BANDAGES/DRESSINGS) ×2 IMPLANT
PAD OB MATERNITY 4.3X12.25 (PERSONAL CARE ITEMS) ×4 IMPLANT
RTRCTR C-SECT PINK 25CM LRG (MISCELLANEOUS) IMPLANT
SCRUB PCMX 4 OZ (MISCELLANEOUS) ×2 IMPLANT
SLEEVE SCD COMPRESS KNEE MED (MISCELLANEOUS) IMPLANT
STAPLER VISISTAT 35W (STAPLE) IMPLANT
STRIP CLOSURE SKIN 1/2X4 (GAUZE/BANDAGES/DRESSINGS) ×4 IMPLANT
SUT MNCRL 0 VIOLET CTX 36 (SUTURE) ×7 IMPLANT
SUT MNCRL AB 3-0 PS2 27 (SUTURE) ×3 IMPLANT
SUT MON AB 2-0 CT1 27 (SUTURE) IMPLANT
SUT MONOCRYL 0 CTX 36 (SUTURE) ×3
SUT PDS AB 0 CTX 36 PDP370T (SUTURE) ×4 IMPLANT
SUT PLAIN 0 NONE (SUTURE) IMPLANT
SUT VIC AB 0 CTXB 36 (SUTURE) IMPLANT
SUT VIC AB 2-0 CT1 (SUTURE) ×4 IMPLANT
SUT VIC AB 2-0 CT1 27 (SUTURE) ×4
SUT VIC AB 2-0 CT1 TAPERPNT 27 (SUTURE) ×3 IMPLANT
SUT VIC AB 2-0 SH 27 (SUTURE)
SUT VIC AB 2-0 SH 27XBRD (SUTURE) IMPLANT
TAPE CLOTH SURG 4X10 WHT LF (GAUZE/BANDAGES/DRESSINGS) ×2 IMPLANT
TOWEL OR 17X24 6PK STRL BLUE (TOWEL DISPOSABLE) ×12 IMPLANT
TRAY FOLEY CATH 14FR (SET/KITS/TRAYS/PACK) ×4 IMPLANT
WATER STERILE IRR 1000ML POUR (IV SOLUTION) ×2 IMPLANT

## 2012-08-22 NOTE — Interval H&P Note (Signed)
History and Physical Interval Note:  08/22/2012 2:46 PM  Danielle [name]  has presented today for surgery, with the diagnosis of polyhydramnios hx of fetal demise repeat  The various methods of treatment have been discussed with the patient and family. After consideration of risks, benefits and other options for treatment, the patient has consented to  Procedure(s): CESAREAN SECTION WITH BILATERAL TUBAL LIGATION (Bilateral) as a surgical intervention .  The patient's history has been reviewed, patient examined, no change in status, stable for surgery.  I have reviewed the patient's chart and labs.  Questions were answered to the patient's satisfaction.     JACKSON-MOORE,Helaine Yackel A

## 2012-08-22 NOTE — Anesthesia Preprocedure Evaluation (Signed)
Anesthesia Evaluation  Patient identified by MRN, date of birth, ID band Patient awake    Reviewed: Allergy & Precautions, H&P , NPO status , Patient's Chart, lab work & pertinent test results  Airway Mallampati: II TM Distance: >3 FB Neck ROM: full    Dental no notable dental hx.    Pulmonary neg pulmonary ROS,    Pulmonary exam normal       Cardiovascular negative cardio ROS      Neuro/Psych negative neurological ROS  negative psych ROS   GI/Hepatic Neg liver ROS, GERD-  Medicated,  Endo/Other    Renal/GU negative Renal ROS  negative genitourinary   Musculoskeletal negative musculoskeletal ROS (+)   Abdominal Normal abdominal exam  (+)   Peds negative pediatric ROS (+)  Hematology negative hematology ROS (+)   Anesthesia Other Findings   Reproductive/Obstetrics (+) Pregnancy                           Anesthesia Physical Anesthesia Plan  ASA: II  Anesthesia Plan: Spinal   Post-op Pain Management:    Induction:   Airway Management Planned:   Additional Equipment:   Intra-op Plan:   Post-operative Plan:   Informed Consent: I have reviewed the patients History and Physical, chart, labs and discussed the procedure including the risks, benefits and alternatives for the proposed anesthesia with the patient or authorized representative who has indicated his/her understanding and acceptance.     Plan Discussed with: CRNA and Surgeon  Anesthesia Plan Comments:         Anesthesia Quick Evaluation

## 2012-08-22 NOTE — Op Note (Signed)
Cesarean Section Procedure Note   Danielle [name]   08/22/2012  Indications: Intrauterine pregnancy at 38+ weeks history of a previous intrauterine fetal demise at term, history of a previous cesarean delivery, desires a sterilization procedure   Pre-operative Diagnosis: Intrauterine pregnancy at 38+ weeks history of a previous intrauterine fetal demise at term, history of a previous cesarean delivery, desires a sterilization procedure  Post-operative Diagnosis: Same   Surgeon: Roseanna Rainbow  Assistants: Coral Ceo A  Anesthesia: spinal  Procedure Details:  The patient was seen in the Holding Room. The risks, benefits, complications, treatment options, and expected outcomes were discussed with the patient. The patient concurred with the proposed plan, giving informed consent. The patient was identified as Danielle [name] and the procedure verified as C-Section Delivery. A Time Out was held and the above information confirmed.  After induction of anesthesia, the patient was draped and prepped in the usual sterile manner. A transverse incision was made and carried down through the subcutaneous tissue to the fascia. The fascial incision was made and extended transversely. The fascia was separated from the underlying rectus tissue superiorly and inferiorly. The peritoneum was identified and entered. The peritoneal incision was extended longitudinally. The utero-vesical peritoneal reflection was incised transversely and the bladder flap was bluntly freed from the lower uterine segment. A low transverse uterine incision was made. Delivered from cephalic presentation was a  living newborn female infant(s). APGAR (1 MIN): 9   APGAR (5 MINS): 9   :    A cord ph was not sent. The umbilical cord was clamped and cut cord. A sample was obtained for evaluation. The placenta was removed Intact and appeared normal.  The uterine incision was closed with running locked sutures of 1-0 Monocryl. A  second imbricating layer of the same suture was placed.  Hemostasis was observed. The paracolic gutters were irrigated. The parieto peritoneum was closed in a running fashion with 2-0 Vicryl.  The fascia was then reapproximated with running sutures of 0 PDS.  The skin was closed with suture.  Instrument, sponge, and needle counts were correct prior the abdominal closure and were correct at the conclusion of the case.    Findings:  See above   Estimated Blood Loss: 400 mL Total IV Fluids: Per anesthesiology  Urine Output: Per anesthesiology  Specimens: None  Complications: no complications  Disposition: PACU - hemodynamically stable.  Maternal Condition: stable   Baby condition / location:  nursery-stable    Signed: Surgeon(s): Antionette Char, MD

## 2012-08-22 NOTE — OR Nursing (Signed)
Patient stated she was not allergic to condoms and that she had no latex allergy.

## 2012-08-22 NOTE — Transfer of Care (Signed)
Immediate Anesthesia Transfer of Care Note  Patient: Danielle [name]  Procedure(s) Performed: Procedure(s): BILATERAL TUBAL LIGATION (Bilateral) CESAREAN SECTION (N/A)  Patient Location: PACU  Anesthesia Type:Spinal  Level of Consciousness: awake, alert  and oriented  Airway & Oxygen Therapy: Patient Spontanous Breathing  Post-op Assessment: Report given to PACU RN and Post -op Vital signs reviewed and stable  Post vital signs: Reviewed and stable  Complications: No apparent anesthesia complications

## 2012-08-22 NOTE — Anesthesia Postprocedure Evaluation (Signed)
Anesthesia Post Note  Patient: Danielle [name]  Procedure(s) Performed: Procedure(s) (LRB): BILATERAL TUBAL LIGATION (Bilateral) CESAREAN SECTION (N/A)  Anesthesia type: Spinal  Patient location: PACU  Post pain: Pain level controlled  Post assessment: Post-op Vital signs reviewed  Last Vitals:  Filed Vitals:   08/22/12 1800  BP: 129/64  Pulse: 73  Temp:   Resp: 21    Post vital signs: Reviewed  Level of consciousness: awake  Complications: No apparent anesthesia complications

## 2012-08-22 NOTE — Anesthesia Procedure Notes (Signed)
Spinal  Patient location during procedure: OR Start time: 08/22/2012 3:45 PM End time: 08/22/2012 3:48 PM Staffing Anesthesiologist: Sandrea Hughs Performed by: anesthesiologist  Preanesthetic Checklist Completed: patient identified, site marked, surgical consent, pre-op evaluation, timeout performed, IV checked, risks and benefits discussed and monitors and equipment checked Spinal Block Patient position: sitting Prep: DuraPrep Patient monitoring: heart rate, cardiac monitor, continuous pulse ox and blood pressure Approach: midline Location: L3-4 Injection technique: single-shot Needle Needle type: Sprotte  Needle gauge: 24 G Needle length: 9 cm Needle insertion depth: 7 cm Assessment Sensory level: T4

## 2012-08-22 NOTE — Consult Note (Signed)
Neonatology Note:   Attendance at C-section:    I was asked by Dr. Clearance Coots to attend this repeat C/S at term. The mother is a G8P3A4 A pos, GBS pos with polyhydramnios and history of hypo- and hyperthyroidism . ROM at delivery, fluid clear. Infant vigorous with good spontaneous cry and tone. Needed only minimal bulb suctioning. Ap 9/9. Lungs clear to ausc in DR. To CN to care of Pediatrician. Some of mother's prenatal labs not on chart, so have asked CN nurse to call the OB office to get these.   Doretha Sou, MD

## 2012-08-23 ENCOUNTER — Encounter (HOSPITAL_COMMUNITY): Payer: Self-pay | Admitting: Obstetrics & Gynecology

## 2012-08-23 LAB — CBC
MCV: 83.6 fL (ref 78.0–100.0)
Platelets: 272 10*3/uL (ref 150–400)
RBC: 3.71 MIL/uL — ABNORMAL LOW (ref 3.87–5.11)
WBC: 11.2 10*3/uL — ABNORMAL HIGH (ref 4.0–10.5)

## 2012-08-23 NOTE — Progress Notes (Signed)
I received a referral from RN because of pt's history of loss.  Danielle [name] was in good spirits when I spoke with her.  She is focusing on being grateful for what she has.  She has a strong faith and that has helped get her through this period.  She was not interested in speaking further at this time, but she is aware of chaplain availability.  [address] [place] Pager, [phone] 1:43 PM   08/23/12 1300  Clinical Encounter Type  Visited With Patient  Visit Type Initial  Referral From Nurse

## 2012-08-23 NOTE — Progress Notes (Signed)
UR chart review completed.  

## 2012-08-23 NOTE — Anesthesia Postprocedure Evaluation (Signed)
Anesthesia Post Note  Patient: Danielle [name]  Procedure(s) Performed: Procedure(s) (LRB): BILATERAL TUBAL LIGATION (Bilateral) CESAREAN SECTION (N/A)  Anesthesia type: SAB  Patient location: Mother/Baby  Post pain: Pain level controlled  Post assessment: Post-op Vital signs reviewed  Last Vitals:  Filed Vitals:   08/23/12 0500  BP: 124/81  Pulse: 69  Temp: 36.5 C  Resp: 18    Post vital signs: Reviewed  Level of consciousness: awake  Complications: No apparent anesthesia complications

## 2012-08-23 NOTE — Progress Notes (Signed)
Please enter HB and HIV results. Thanks

## 2012-08-24 NOTE — Progress Notes (Signed)
Subjective: Postpartum Day 2: Cesarean Delivery Patient reports incisional pain, + flatus and no problems voiding.    Objective: Vital signs in last 24 hours: Temp:  [97.2 F (36.2 C)-98.5 F (36.9 C)] 97.2 F (36.2 C) (04/05 0500) Pulse Rate:  [69-82] 75 (04/05 0730) Resp:  [18] 18 (04/05 0500) BP: (127-154)/(77-108) 127/77 mmHg (04/05 0730) SpO2:  [100 %] 100 % (04/05 0500)  Physical Exam:  General: alert and no distress Lochia: appropriate Uterine Fundus: firm Incision: healing well DVT Evaluation: No evidence of DVT seen on physical exam.   Recent Labs  08/22/12 1415 08/23/12 0605  HGB 11.9* 10.3*  HCT 35.7* 31.0*    Assessment/Plan: Status post Cesarean section. Doing well postoperatively.  Continue current care.  Danielle [name] A 08/24/2012, 4:50 PM

## 2012-08-25 MED ORDER — DIPHENHYDRAMINE HCL 25 MG PO CAPS: 25.0000 mg | ORAL_CAPSULE | Freq: Four times a day (QID) | ORAL | Status: DC | PRN

## 2012-08-25 MED ORDER — FUSION PLUS PO CAPS: 1.0000 | ORAL_CAPSULE | Freq: Every day | ORAL | Status: DC

## 2012-08-25 MED ORDER — OXYCODONE-ACETAMINOPHEN 5-325 MG PO TABS: 1.0000 | ORAL_TABLET | ORAL | Status: DC | PRN

## 2012-08-25 MED ORDER — IBUPROFEN 600 MG PO TABS: 600.0000 mg | ORAL_TABLET | Freq: Four times a day (QID) | ORAL | Status: DC | PRN

## 2012-08-25 NOTE — Discharge Summary (Signed)
Obstetric Discharge Summary Reason for Admission: cesarean section Prenatal Procedures: ultrasound Intrapartum Procedures: cesarean: low cervical, transverse Postpartum Procedures: none Complications-Operative and Postpartum: none Hemoglobin  Date Value Range Status  08/23/2012 10.3* 12.0 - 15.0 g/dL Final     HCT  Date Value Range Status  08/23/2012 31.0* 36.0 - 46.0 % Final    Physical Exam:  General: alert and no distress Lochia: appropriate Uterine Fundus: firm Incision: healing well DVT Evaluation: No evidence of DVT seen on physical exam.  Discharge Diagnoses: Term Pregnancy-delivered  Discharge Information: Date: 08/25/2012 Activity: pelvic rest Diet: routine Medications: PNV, Ibuprofen, Colace, Iron and Percocet Condition: stable Instructions: refer to practice specific booklet Discharge to: home Follow-up Information   Follow up with Antionette Char A, MD. Schedule an appointment as soon as possible for a visit in 2 weeks.   Contact information:   [address] [place] [address] [phone]       Newborn Data: Live born female  Birth Weight: 6 lb 10.4 oz (3015 g) APGAR: 9, 9  Home with mother.  HARPER,CHARLES A 08/25/2012, 6:38 AM

## 2012-08-25 NOTE — Progress Notes (Signed)
Subjective: Postpartum Day 3: Cesarean Delivery Patient reports tolerating PO, + flatus, + BM and no problems voiding.    Objective: Vital signs in last 24 hours: Temp:  [98.3 F (36.8 C)-98.5 F (36.9 C)] 98.5 F (36.9 C) (04/06 0505) Pulse Rate:  [71-80] 71 (04/06 0505) Resp:  [18] 18 (04/06 0505) BP: (123-142)/(77-86) 142/82 mmHg (04/06 0505)  Physical Exam:  General: alert and no distress Lochia: appropriate Uterine Fundus: firm Incision: healing well DVT Evaluation: No evidence of DVT seen on physical exam.   Recent Labs  08/22/12 1415 08/23/12 0605  HGB 11.9* 10.3*  HCT 35.7* 31.0*    Assessment/Plan: Status post Cesarean section. Doing well postoperatively.  Discharge home with standard precautions and return to clinic in 2 weeks.  HARPER,CHARLES A 08/25/2012, 6:30 AM

## 2012-09-16 ENCOUNTER — Encounter: Payer: Self-pay | Admitting: Obstetrics & Gynecology

## 2012-09-16 ENCOUNTER — Ambulatory Visit: Payer: Medicaid Other | Admitting: *Deleted

## 2012-09-16 NOTE — Progress Notes (Unsigned)
.   Subjective:     NAZARENE BUNNING is a 42 y.o. female who presents for a postpartum visit. She is 2 weeks postpartum following a low cervical vertical Cesarean section. I have fully reviewed the prenatal and intrapartum course. The delivery was at 38 gestational weeks. Outcome: repeat cesarean section, low transverse incision. Anesthesia: epidural. Postpartum course has been normal. Baby's course has been normal. Baby is feeding by both breast and bottle [facility]. Bleeding no bleeding. Bowel function is normal. Bladder function is normal. Patient is not sexually active. Contraception method is none. Patient had a tubal ligation. Postpartum depression screening: {neg default:13464::"negative"}.  {Common ambulatory SmartLinks:19316}  Review of Systems {ros; complete:30496}   Objective:                                            Assessment:   Nurse visit - patient did not see the doctor today.   Incision is closed and healing well.  No drainage, redness or odor from the site. Patient denies any pain from incision.   Patient instructed to make an appointment for her 6 week postpartum visit.    Plan:   1. Contraception: tubal ligation 2. 3. Follow up in: 4 weeks or as needed.

## 2012-09-30 ENCOUNTER — Encounter: Payer: Self-pay | Admitting: Obstetrics & Gynecology

## 2012-09-30 ENCOUNTER — Ambulatory Visit (INDEPENDENT_AMBULATORY_CARE_PROVIDER_SITE_OTHER): Payer: Medicaid Other | Admitting: Obstetrics & Gynecology

## 2012-09-30 NOTE — Progress Notes (Signed)
Subjective:     Danielle [name] is a 42 y.o. female who presents for a postpartum visit. She is 6 weeks postpartum following a low cervical vertical Cesarean section. I have fully reviewed the prenatal and intrapartum course. The delivery was at 38 gestational weeks. Outcome: repeat cesarean section, low transverse incision. Anesthesia: epidural. Postpartum course has been normal. Baby's course has been normal. Baby is feeding by both breast and bottle - [employer]. Bleeding no bleeding. Bowel function is normal. Bladder function is normal. Patient is not sexually active. Contraception method is tubal ligation. Postpartum depression screening: negative.  The following portions of the patient's history were reviewed and updated as appropriate: allergies, current medications, past family history, past medical history, past social history, past surgical history and problem list.  Review of Systems Pertinent items are noted in HPI.   Objective:    BP 138/89  Pulse 78  Temp(Src) 97.4 F (36.3 C) (Oral)  Ht 5\' 6"  (1.676 m)  Wt 206 lb (93.441 kg)  BMI 33.27 kg/m2  Breastfeeding? Yes  General:  alert and no distress   Breasts:  inspection negative, no nipple discharge or bleeding, no masses or nodularity palpable  Lungs: not examined  Heart:  not examined  Abdomen: normal findings: soft, non-tender   Vulva:  normal  Vagina: normal vagina  Cervix:  no lesions  Corpus: normal size, contour, position, consistency, mobility, non-tender  Adnexa:  no mass, fullness, tenderness  Rectal Exam: Not performed.        Assessment:     Normal postpartum exam. Pap smear not done at today's visit.   Plan:    1. Contraception: BTL  2.  F/U for annual.

## 2012-10-10 ENCOUNTER — Encounter: Payer: Self-pay | Admitting: Obstetrics & Gynecology

## 2012-10-10 NOTE — Patient Instructions (Signed)
Routine

## 2012-10-17 ENCOUNTER — Ambulatory Visit: Payer: Medicaid Other | Admitting: Obstetrics & Gynecology

## 2012-10-21 ENCOUNTER — Ambulatory Visit: Payer: Self-pay | Admitting: Obstetrics & Gynecology

## 2012-10-31 ENCOUNTER — Ambulatory Visit: Payer: Medicaid Other | Admitting: Obstetrics & Gynecology

## 2013-01-04 ENCOUNTER — Encounter (HOSPITAL_BASED_OUTPATIENT_CLINIC_OR_DEPARTMENT_OTHER): Payer: Self-pay | Admitting: *Deleted

## 2013-01-04 ENCOUNTER — Emergency Department (HOSPITAL_BASED_OUTPATIENT_CLINIC_OR_DEPARTMENT_OTHER)
Admission: EM | Admit: 2013-01-04 | Discharge: 2013-01-04 | Disposition: A | Discharge status: Home / Self Care | Payer: Self-pay | Attending: Emergency Medicine | Admitting: Emergency Medicine

## 2013-01-04 DIAGNOSIS — Y92009 Unspecified place in unspecified non-institutional (private) residence as the place of occurrence of the external cause: Secondary | ICD-10-CM | POA: Insufficient documentation

## 2013-01-04 DIAGNOSIS — Z79899 Other long term (current) drug therapy: Secondary | ICD-10-CM | POA: Insufficient documentation

## 2013-01-04 DIAGNOSIS — Y939 Activity, unspecified: Secondary | ICD-10-CM | POA: Insufficient documentation

## 2013-01-04 DIAGNOSIS — T5991XA Toxic effect of unspecified gases, fumes and vapors, accidental (unintentional), initial encounter: Secondary | ICD-10-CM | POA: Insufficient documentation

## 2013-01-04 DIAGNOSIS — T5894XA Toxic effect of carbon monoxide from unspecified source, undetermined, initial encounter: Secondary | ICD-10-CM | POA: Insufficient documentation

## 2013-01-04 DIAGNOSIS — Z8639 Personal history of other endocrine, nutritional and metabolic disease: Secondary | ICD-10-CM | POA: Insufficient documentation

## 2013-01-04 DIAGNOSIS — R42 Dizziness and giddiness: Secondary | ICD-10-CM | POA: Insufficient documentation

## 2013-01-04 DIAGNOSIS — Z8719 Personal history of other diseases of the digestive system: Secondary | ICD-10-CM | POA: Insufficient documentation

## 2013-01-04 DIAGNOSIS — R51 Headache: Secondary | ICD-10-CM | POA: Insufficient documentation

## 2013-01-04 DIAGNOSIS — Z87891 Personal history of nicotine dependence: Secondary | ICD-10-CM | POA: Insufficient documentation

## 2013-01-04 DIAGNOSIS — Z862 Personal history of diseases of the blood and blood-forming organs and certain disorders involving the immune mechanism: Secondary | ICD-10-CM | POA: Insufficient documentation

## 2013-01-04 DIAGNOSIS — Z7729 Contact with and (suspected ) exposure to other hazardous substances: Secondary | ICD-10-CM

## 2013-01-04 NOTE — ED Notes (Signed)
Carboxyhemaglobin percent done via handheld Pulse-CO oximeter. Reading of 4% on room air initial reading. MD aware, will continue to monitor.

## 2013-01-04 NOTE — ED Notes (Addendum)
Pt exposed to carbon monoxide presents with runny nose and nasal congestion
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
## 2013-02-10 ENCOUNTER — Encounter (HOSPITAL_BASED_OUTPATIENT_CLINIC_OR_DEPARTMENT_OTHER): Payer: Self-pay | Admitting: *Deleted

## 2013-02-10 ENCOUNTER — Emergency Department (HOSPITAL_BASED_OUTPATIENT_CLINIC_OR_DEPARTMENT_OTHER)
Admission: EM | Admit: 2013-02-10 | Discharge: 2013-02-11 | Disposition: A | Discharge status: Home / Self Care | Payer: Medicaid Other | Attending: Emergency Medicine | Admitting: Emergency Medicine

## 2013-02-10 DIAGNOSIS — S59909A Unspecified injury of unspecified elbow, initial encounter: Secondary | ICD-10-CM | POA: Insufficient documentation

## 2013-02-10 DIAGNOSIS — Y9289 Other specified places as the place of occurrence of the external cause: Secondary | ICD-10-CM | POA: Insufficient documentation

## 2013-02-10 DIAGNOSIS — Z8719 Personal history of other diseases of the digestive system: Secondary | ICD-10-CM | POA: Insufficient documentation

## 2013-02-10 DIAGNOSIS — Z8639 Personal history of other endocrine, nutritional and metabolic disease: Secondary | ICD-10-CM | POA: Insufficient documentation

## 2013-02-10 DIAGNOSIS — Y9301 Activity, walking, marching and hiking: Secondary | ICD-10-CM | POA: Insufficient documentation

## 2013-02-10 DIAGNOSIS — W010XXA Fall on same level from slipping, tripping and stumbling without subsequent striking against object, initial encounter: Secondary | ICD-10-CM | POA: Insufficient documentation

## 2013-02-10 DIAGNOSIS — S8990XA Unspecified injury of unspecified lower leg, initial encounter: Secondary | ICD-10-CM | POA: Insufficient documentation

## 2013-02-10 DIAGNOSIS — Z87891 Personal history of nicotine dependence: Secondary | ICD-10-CM | POA: Insufficient documentation

## 2013-02-10 DIAGNOSIS — T07XXXA Unspecified multiple injuries, initial encounter: Secondary | ICD-10-CM | POA: Insufficient documentation

## 2013-02-10 DIAGNOSIS — S6990XA Unspecified injury of unspecified wrist, hand and finger(s), initial encounter: Secondary | ICD-10-CM | POA: Insufficient documentation

## 2013-02-10 DIAGNOSIS — Z862 Personal history of diseases of the blood and blood-forming organs and certain disorders involving the immune mechanism: Secondary | ICD-10-CM | POA: Insufficient documentation

## 2013-02-10 NOTE — ED Notes (Signed)
Pt c/o fall x 1 hr ago c/o left knee, arm and wrist pain

## 2013-02-11 ENCOUNTER — Emergency Department (HOSPITAL_BASED_OUTPATIENT_CLINIC_OR_DEPARTMENT_OTHER): Payer: Medicaid Other

## 2013-02-11 MED ORDER — HYDROCODONE-ACETAMINOPHEN 5-325 MG PO TABS
1.0000 | ORAL_TABLET | Freq: Once | ORAL | Status: AC
  Administered 2013-02-11: 1 via ORAL
  Filled 2013-02-11: qty 1

## 2013-02-11 MED ORDER — HYDROCODONE-ACETAMINOPHEN 5-325 MG PO TABS: 1.0000 | ORAL_TABLET | Freq: Four times a day (QID) | ORAL | Status: DC | PRN

## 2013-02-11 NOTE — ED Notes (Signed)
MD at bedside. 

## 2013-02-11 NOTE — ED Provider Notes (Addendum)
CSN: [id number]     Arrival date & time 02/10/13  2347 History  This chart was scribed for Hanley Seamen, MD by Ardelia Mems, ED Scribe. This patient was seen in room MH01/MH01 and the patient's care was started at 12:06 AM.   Chief Complaint  Patient presents with  . Fall    The history is provided by the patient. No language interpreter was used.   HPI Comments: Danielle [name] is a 42 y.o. female who presents to the Emergency Department complaining of a fall that occurred about 1.5 hours ago while walking in at [employer] in [place], [place]. She states that the floor had recently been mopped, and there was no wet floor sign to warn her. She also states that she was wearing sandals, and that she slipped and fell on her left side. She is complaining of constant, moderate neck, bilateral wrist, left knee and left foot pain. She characterizes all of her pain as "soreness". She states that she does not believe she sustained any fractures or serious injuries, but she expresses that she would like to have X-rays of her neck and wrists. She denies head injury, LOC or any other symptoms.   Past Medical History  Diagnosis Date  . Thyroid disease     hyper and hypo  . Hyperthyroidism   . Hypothyroidism   . GERD (gastroesophageal reflux disease)    Past Surgical History  Procedure Laterality Date  . Cesarean section    . Dilation and curettage of uterus    . Tubal ligation Bilateral 08/22/2012    Procedure: BILATERAL TUBAL LIGATION;  Surgeon: Antionette Char, MD;  Location: [facility];  Service: Obstetrics;  Laterality: Bilateral;  . Cesarean section N/A 08/22/2012    Procedure: CESAREAN SECTION;  Surgeon: Antionette Char, MD;  Location: [facility];  Service: Obstetrics;  Laterality: N/A;   Family History  Problem Relation Age of Onset  . Hypertension Mother   . Diabetes Father   . Cancer Father    History  Substance Use Topics  . Smoking status: Former Games developer  . Smokeless tobacco: Never Used   . Alcohol Use: No   OB History   Grav Para Term Preterm Abortions TAB SAB Ect Mult Living   8 4 4  4 2 1 1  0 2     Review of Systems A complete 10 system review of systems was obtained and all systems are negative except as noted in the HPI and PMH.   Allergies  Condoms - female  Home Medications   Current Outpatient Rx  Name  Route  Sig  Dispense  Refill  . ibuprofen (ADVIL,MOTRIN) 600 MG tablet   Oral   Take 1 tablet (600 mg total) by mouth every 6 (six) hours as needed for pain.   30 tablet   5    Triage Vitals: BP 148/86  Pulse 74  Temp(Src) 98.6 F (37 C) (Oral)  Resp 16  Ht 5\' 6"  (1.676 m)  Wt 206 lb (93.441 kg)  BMI 33.27 kg/m2  SpO2 100%  LMP 02/09/2013  Breastfeeding? No  Physical Exam  Nursing note and vitals reviewed. Constitutional: She is oriented to person, place, and time. She appears well-developed and well-nourished. No distress.  HENT:  Head: Normocephalic and atraumatic.  Eyes: EOM are normal.  Neck: Neck supple. No tracheal deviation present.  Tenderness over the posterolateral aspect of her neck, but no C-spine tenderness. Mild soft tissue tenderness of the neck without deformity or bony  point tenderness.  Cardiovascular: Normal rate.   Pulmonary/Chest: Effort normal. No respiratory distress.  Musculoskeletal: Normal range of motion.  Tenderness on the wrist bilaterally. Mild soft tissue tenderness of the left knee without deformity or bony point tenderness.  Neurological: She is alert and oriented to person, place, and time.  Skin: Skin is warm and dry.  Psychiatric: She has a normal mood and affect. Her behavior is normal.   Patient states she does not believe any bones are broken but is insisting on x-rays.  ED Course  Procedures (including critical care time)  DIAGNOSTIC STUDIES: Oxygen Saturation is 100% on RA, normal by my interpretation.    COORDINATION OF CARE: 12:12 AM- Pt advised of plan to receive Norco in the ED, along with  plan for diagnostic radiology of her neck and both wrists. Pt agrees with plan for treatment.  Medications  HYDROcodone-acetaminophen (NORCO/VICODIN) 5-325 MG per tablet 1 tablet (1 tablet Oral Given 02/11/13 0017)    MDM  Nursing notes and vitals signs, including pulse oximetry, reviewed.  Summary of this visit's results, reviewed by myself:  Labs:  No results found for this or any previous visit (from the past 24 hour(s)).  Imaging Studies: Dg Cervical Spine Complete  02/11/2013   CLINICAL DATA:  Status post fall.  Neck pain.  EXAM: CERVICAL SPINE  4+ VIEWS  COMPARISON:  None.  FINDINGS: There is no evidence of cervical spine fracture or prevertebral soft tissue swelling. Alignment is normal. No other significant bone abnormalities are identified.  IMPRESSION: Negative cervical spine radiographs.   Electronically Signed   By: Drusilla Kanner M.D.   On: 02/11/2013 01:00   Dg Wrist Complete Left  02/11/2013   CLINICAL DATA:  Status post fall. Left wrist pain.  EXAM: LEFT WRIST - COMPLETE 3+ VIEW  COMPARISON:  None.  FINDINGS: There is no evidence of fracture or dislocation. There is no evidence of arthropathy or other focal bone abnormality. Soft tissues are unremarkable.  IMPRESSION: Negative.   Electronically Signed   By: Drusilla Kanner M.D.   On: 02/11/2013 01:00   Dg Wrist Complete Right  02/11/2013   CLINICAL DATA:  Status post fall. Right wrist pain.  EXAM: RIGHT WRIST - COMPLETE 3+ VIEW  COMPARISON:  None.  FINDINGS: There is no evidence of fracture or dislocation. There is no evidence of arthropathy or other focal bone abnormality. Soft tissues are unremarkable.  IMPRESSION: Negative.   Electronically Signed   By: Drusilla Kanner M.D.   On: 02/11/2013 01:01       I personally performed the services described in this documentation, which was scribed in my presence.  The recorded information has been reviewed and is accurate.     Hanley Seamen, MD 02/11/13 0115  Hanley Seamen, MD 02/11/13 1610

## 2013-02-13 ENCOUNTER — Encounter: Payer: Self-pay | Admitting: Family Medicine

## 2013-02-13 ENCOUNTER — Ambulatory Visit (INDEPENDENT_AMBULATORY_CARE_PROVIDER_SITE_OTHER): Payer: Medicaid Other | Admitting: Family Medicine

## 2013-02-13 VITALS — BP 152/109 | HR 83 | Ht 66.0 in | Wt 206.0 lb

## 2013-02-13 DIAGNOSIS — M542 Cervicalgia: Secondary | ICD-10-CM

## 2013-02-13 DIAGNOSIS — W19XXXA Unspecified fall, initial encounter: Secondary | ICD-10-CM

## 2013-02-13 MED ORDER — CYCLOBENZAPRINE HCL 10 MG PO TABS: 10.0000 mg | ORAL_TABLET | Freq: Three times a day (TID) | ORAL | Status: DC | PRN

## 2013-02-13 MED ORDER — HYDROCODONE-ACETAMINOPHEN 5-325 MG PO TABS: 1.0000 | ORAL_TABLET | Freq: Four times a day (QID) | ORAL | Status: DC | PRN

## 2013-02-13 MED ORDER — DICLOFENAC SODIUM 75 MG PO TBEC: 75.0000 mg | DELAYED_RELEASE_TABLET | Freq: Two times a day (BID) | ORAL | Status: DC

## 2013-02-13 NOTE — Patient Instructions (Addendum)
You have sustained multiple contusions, muscle strains. Start with Voltaren 75mg  twice a day with food for pain and inflammation. Norco/vicodin as needed for severe pain (no driving on this medicine). Flexeril as needed for spasms (don't try this until this weekend - can make you sleepy - if it lingers in the morning, don't take during the work week). Home exercises as directed - 3 sets of 10 once a day.  For stretches hold them 20-30 seconds, repeat 3 times. Follow up with me in 2 weeks for reevaluation.

## 2013-02-13 NOTE — Progress Notes (Signed)
Patient ID: Danielle [name], female   DOB: December 08, 1970, 42 y.o.   MRN: [id number]  PCP: No PCP Per Patient  Subjective:   HPI: Patient is a 42 y.o. female here for multiple complaints.  Patient reports on 9/22 she was in a [employer] carrying groceries when she slipped on a puddle of water and fell. Believes she twisted, fell toward left side and onto wrists but happened very quickly. Initial pain greatest in neck, wrists - radiographs negative in ED. Now a few days later reports pain throughout entire body including both feet/ankles, low back, neck, wrists. No prior issues with these areas. Using heat and taking norco for pain  Past Medical History  Diagnosis Date  . Thyroid disease     hyper and hypo  . Hyperthyroidism   . Hypothyroidism   . GERD (gastroesophageal reflux disease)     No current outpatient prescriptions on file prior to visit.   No current facility-administered medications on file prior to visit.    Past Surgical History  Procedure Laterality Date  . Cesarean section    . Dilation and curettage of uterus    . Tubal ligation Bilateral 08/22/2012    Procedure: BILATERAL TUBAL LIGATION;  Surgeon: Antionette Char, MD;  Location: [facility];  Service: Obstetrics;  Laterality: Bilateral;  . Cesarean section N/A 08/22/2012    Procedure: CESAREAN SECTION;  Surgeon: Antionette Char, MD;  Location: [facility];  Service: Obstetrics;  Laterality: N/A;    Allergies  Allergen Reactions  . Condoms - Female [Nonoxynol 9] Hives    Certain brand she was not sure about.    History   Social History  . Marital Status: Single    Spouse Name: N/A    Number of Children: N/A  . Years of Education: N/A   Occupational History  . Not on file.   Social History Main Topics  . Smoking status: Former Games developer  . Smokeless tobacco: Never Used  . Alcohol Use: No  . Drug Use: No  . Sexual Activity: Not Currently    Partners: Male    Birth Control/ Protection: Surgical   Other  Topics Concern  . Not on file   Social History Narrative  . No narrative on file    Family History  Problem Relation Age of Onset  . Hypertension Mother   . Diabetes Father   . Cancer Father   . Hyperlipidemia Neg Hx   . Heart attack Neg Hx   . Sudden death Neg Hx     BP 152/109  Pulse 83  Ht 5\' 6"  (1.676 m)  Wt 206 lb (93.441 kg)  BMI 33.27 kg/m2  LMP 02/09/2013  Review of Systems: See HPI above.    Objective:  Physical Exam:  Gen: NAD  Neck: No gross deformity, swelling, bruising. TTP left cervical paraspinal region.  No midline/bony TTP. FROM neck - pain with left lat rotation, extension. BUE strength 5/5 except 5-/5 bilateral triceps extension.   Sensation intact to light touch.   2+ equal reflexes in triceps, biceps, brachioradialis tendons. Negative spurlings. NV intact distal BUEs.  Back: No gross deformity, scoliosis. TTP bilateral paraspinal regions lumbar spine.  No midline or bony TTP. FROM with pain on flexion. Strength LEs 5/5 all muscle groups.   2+ MSRs in patellar and achilles tendons, equal bilaterally. Negative SLRs. Sensation intact to light touch bilaterally. Negative logroll bilateral hips.  Bilateral ankles/feet: No gross deformity, swelling, ecchymoses FROM TTP mildly over ATFLs.  No focal malleolar,  navicular, base 5th, fibular head TTP.  TTP lateral and medial gastrocs. Trace ant drawer and talar tilt.   Negative syndesmotic compression. Thompsons test negative. NV intact distally.  Bilateral wrists: No gross deformity, swelling, bruising. FROM. TTP dorsal wrists but non focal, no snuffbox TTP. NVI distally.    Assessment & Plan:  1. Fall - radiographs of both wrists, cervical spine negative for fracture.  Exam benign of neck, back, ankles/feet and wrists.  2/2 multiple contusions, muscle strains.  Start with relative rest, voltaren, norco as needed for pain, flexeril for spasms.  HEP.  Declined formal PT for now.  F/u in 2  weeks for reevaluation.

## 2013-02-13 NOTE — Assessment & Plan Note (Signed)
radiographs of both wrists, cervical spine negative for fracture.  Exam benign of neck, back, ankles/feet and wrists.  2/2 multiple contusions, muscle strains.  Start with relative rest, voltaren, norco as needed for pain, flexeril for spasms.  HEP.  Declined formal PT for now.  F/u in 2 weeks for reevaluation.

## 2013-02-27 ENCOUNTER — Ambulatory Visit (INDEPENDENT_AMBULATORY_CARE_PROVIDER_SITE_OTHER): Payer: Medicaid Other | Admitting: Family Medicine

## 2013-02-27 ENCOUNTER — Encounter: Payer: Self-pay | Admitting: Family Medicine

## 2013-02-27 VITALS — BP 155/111 | HR 88 | Ht 66.0 in | Wt 206.0 lb

## 2013-02-27 DIAGNOSIS — W19XXXD Unspecified fall, subsequent encounter: Secondary | ICD-10-CM

## 2013-02-27 DIAGNOSIS — M542 Cervicalgia: Secondary | ICD-10-CM

## 2013-02-27 DIAGNOSIS — W19XXXA Unspecified fall, initial encounter: Secondary | ICD-10-CM

## 2013-02-27 MED ORDER — MELOXICAM 15 MG PO TABS: 15.0000 mg | ORAL_TABLET | Freq: Every day | ORAL | Status: DC

## 2013-02-27 NOTE — Patient Instructions (Signed)
You have sustained multiple contusions, muscle strains. Take meloxicam 15mg  daily with food for pain and inflammation - I sent this to the [facility] on [address]. Norco/vicodin as needed for severe pain (no driving on this medicine). Home exercises as directed - 3 sets of 10 once a day.  For stretches hold them 20-30 seconds, repeat 3 times. Call me when [facility] coverage goes through - we will start you in physical therapy especially for neck, back, and ankles. Follow up with me 4 weeks following start of physical therapy.

## 2013-03-03 ENCOUNTER — Encounter: Payer: Self-pay | Admitting: Family Medicine

## 2013-03-03 NOTE — Progress Notes (Signed)
Patient ID: Danielle [name], female   DOB: 09/04/1970, 42 y.o.   MRN: [id number]  PCP: No PCP Per Patient  Subjective:   HPI: Patient is a 42 y.o. female here for multiple complaints.  9/25: Patient reports on 9/22 she was in a [employer] carrying groceries when she slipped on a puddle of water and fell. Believes she twisted, fell toward left side and onto wrists but happened very quickly. Initial pain greatest in neck, wrists - radiographs negative in ED. Now a few days later reports pain throughout entire body including both feet/ankles, low back, neck, wrists. No prior issues with these areas. Using heat and taking norco for pain  10/9: Patient returns with continued symptoms in all areas. Pain in ankles, wrists, neck, low back. Doing some home exercises. Using warm compresses, soaking ankles. No change in status from last visit.  Past Medical History  Diagnosis Date  . Thyroid disease     hyper and hypo  . Hyperthyroidism   . Hypothyroidism   . GERD (gastroesophageal reflux disease)     Current Outpatient Prescriptions on File Prior to Visit  Medication Sig Dispense Refill  . diclofenac (VOLTAREN) 75 MG EC tablet Take 1 tablet (75 mg total) by mouth 2 (two) times daily.  60 tablet  1   No current facility-administered medications on file prior to visit.    Past Surgical History  Procedure Laterality Date  . Cesarean section    . Dilation and curettage of uterus    . Tubal ligation Bilateral 08/22/2012    Procedure: BILATERAL TUBAL LIGATION;  Surgeon: Antionette Char, MD;  Location: [facility];  Service: Obstetrics;  Laterality: Bilateral;  . Cesarean section N/A 08/22/2012    Procedure: CESAREAN SECTION;  Surgeon: Antionette Char, MD;  Location: [facility];  Service: Obstetrics;  Laterality: N/A;    Allergies  Allergen Reactions  . Condoms - Female [Nonoxynol 9] Hives    Certain brand she was not sure about.    History   Social History  . Marital Status: Single    Spouse Name: N/A    Number of Children: N/A  . Years of Education: N/A   Occupational History  . Not on file.   Social History Main Topics  . Smoking status: Former Games developer  . Smokeless tobacco: Never Used  . Alcohol Use: No  . Drug Use: No  . Sexual Activity: Not Currently    Partners: Male    Birth Control/ Protection: Surgical   Other Topics Concern  . Not on file   Social History Narrative  . No narrative on file    Family History  Problem Relation Age of Onset  . Hypertension Mother   . Diabetes Father   . Cancer Father   . Hyperlipidemia Neg Hx   . Heart attack Neg Hx   . Sudden death Neg Hx     BP 155/111  Pulse 88  Ht 5\' 6"  (1.676 m)  Wt 206 lb (93.441 kg)  BMI 33.27 kg/m2  LMP 02/09/2013  Review of Systems: See HPI above.    Objective:  Physical Exam:  Gen: NAD  Neck: No gross deformity, swelling, bruising. TTP left cervical paraspinal region.  No midline/bony TTP. FROM neck - pain with left lat rotation, extension. BUE strength 5/5 except 5-/5 bilateral triceps extension.   Sensation intact to light touch.   2+ equal reflexes in triceps, biceps, brachioradialis tendons. Negative spurlings. NV intact distal BUEs.  Bilateral ankles/feet: No gross deformity, swelling,  ecchymoses FROM TTP mildly over ATFLs.  No focal malleolar, navicular, base 5th, fibular head TTP.  TTP lateral and medial gastrocs. Trace ant drawer and talar tilt.   Negative syndesmotic compression. Thompsons test negative. NV intact distally.  Bilateral wrists: No gross deformity, swelling, bruising. FROM. TTP dorsal wrists but non focal, no snuffbox TTP. NVI distally.    Assessment & Plan:  1. Fall - radiographs of both wrists, cervical spine negative for fracture.  Exam benign of neck, back, ankles/feet and wrists.  2/2 multiple contusions, muscle strains.  Limited by patient's lack of insurance - she's unsure if grocery store will cover her treatment.  Given [employer]  coverage paperwork.  Wrist and ankle braces for support.  Call when coverage goes through and will start formal physical therapy especially for neck, back strains and ankle sprains.  F/u in 4 weeks following PT.  Continue nsaids with norco as needed for pain, flexeril for spasms (stated voltaren and flexeril were too expensive though - will get meloxicam at [facility]).  Continue HEP.

## 2013-03-03 NOTE — Assessment & Plan Note (Signed)
radiographs of both wrists, cervical spine negative for fracture.  Exam benign of neck, back, ankles/feet and wrists.  2/2 multiple contusions, muscle strains.  Limited by patient's lack of insurance - she's unsure if grocery store will cover her treatment.  Given [employer] coverage paperwork.  Wrist and ankle braces for support.  Call when coverage goes through and will start formal physical therapy especially for neck, back strains and ankle sprains.  F/u in 4 weeks following PT.  Continue nsaids with norco as needed for pain, flexeril for spasms (stated voltaren and flexeril were too expensive though - will get meloxicam [facility]).  Continue HEP.

## 2013-03-10 ENCOUNTER — Telehealth: Payer: Self-pay | Admitting: *Deleted

## 2013-03-10 NOTE — Telephone Encounter (Signed)
What's the status of the [employer] Coverage?  She's supposed to call us when that goes through to start physical therapy.  Strongest we can give is to step her down to tramadol 50mg  1 tab every 8 hours as needed for severe pain, no refills.  We would not refill this medication after this script though.

## 2013-03-10 NOTE — Telephone Encounter (Signed)
Patient called and left a message stating they need stronger meds for back. Last office visit was on 10.9.14. Was given a prescription for Meloxicam. Was given Norco on 9.25.14. Patient stated that Voltaren and Flexeril were too expensive. It was in the notes and patient instructions to continue NSAIDS with Norco as needed for pain.

## 2013-04-08 ENCOUNTER — Other Ambulatory Visit: Payer: Self-pay | Admitting: *Deleted

## 2013-04-08 MED ORDER — TRAMADOL HCL 50 MG PO TABS: 50.0000 mg | ORAL_TABLET | Freq: Three times a day (TID) | ORAL | Status: DC

## 2013-04-08 NOTE — Telephone Encounter (Signed)
One time script for tramadol.  Nothing else we can provide to her if she doesn't come through on her end.

## 2013-04-08 NOTE — Telephone Encounter (Signed)
Spoke with patient and told them that we could step down to Tramadol. Wants that called into [facility] on [address]. Still does not have the [employer] Coverage. Trying to get all the paperwork together.

## 2013-05-30 ENCOUNTER — Ambulatory Visit: Payer: Medicaid Other

## 2013-09-29 ENCOUNTER — Ambulatory Visit: Payer: Self-pay

## 2013-09-30 ENCOUNTER — Ambulatory Visit: Payer: Self-pay

## 2013-12-16 ENCOUNTER — Other Ambulatory Visit (HOSPITAL_COMMUNITY): Payer: Self-pay | Admitting: Physician Assistant

## 2013-12-16 DIAGNOSIS — Z1231 Encounter for screening mammogram for malignant neoplasm of breast: Secondary | ICD-10-CM

## 2013-12-24 ENCOUNTER — Ambulatory Visit (HOSPITAL_COMMUNITY)
Admission: RE | Admit: 2013-12-24 | Discharge: 2013-12-24 | Disposition: A | Discharge status: Home / Self Care | Payer: Medicaid Other | Source: Ambulatory Visit | Attending: Physician Assistant | Admitting: Physician Assistant

## 2013-12-24 DIAGNOSIS — Z1231 Encounter for screening mammogram for malignant neoplasm of breast: Secondary | ICD-10-CM

## 2014-03-23 ENCOUNTER — Encounter: Payer: Self-pay | Admitting: Family Medicine

## 2014-09-20 IMAGING — US US OB DETAIL+14 WK
1 series · 12 of 28 positions shown · non-contrast
Comparison: none

[Series 1: us ob detail+14 wk · 0.23mm/px · 97 acquisitions, 12 frames shown]
[im 4/97]
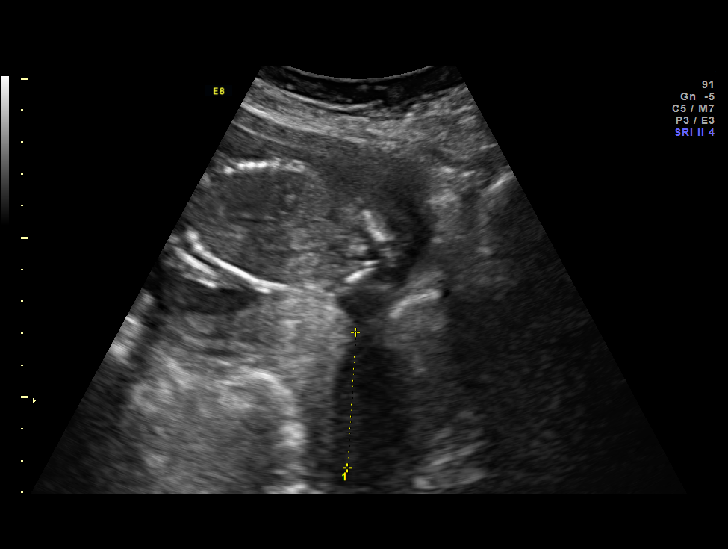
[im 11/97]
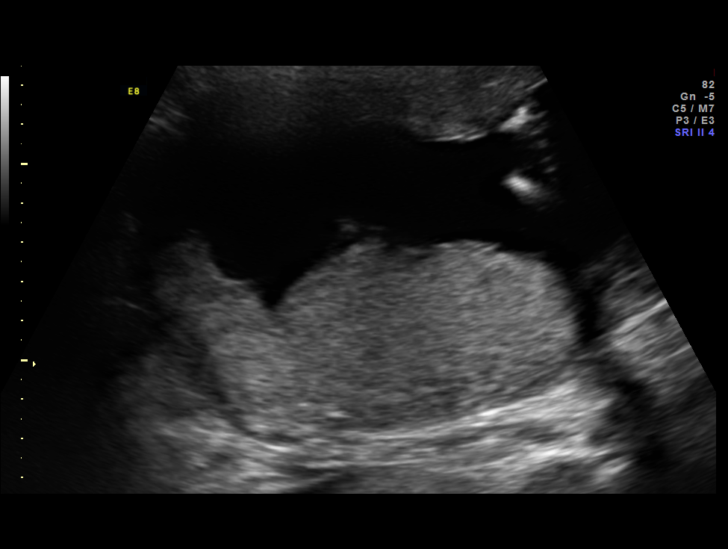
[im 18/97]
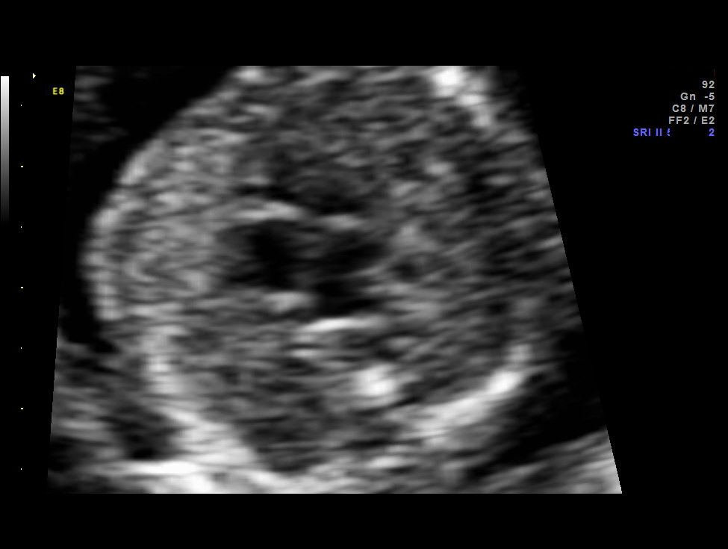
[im 29/97]
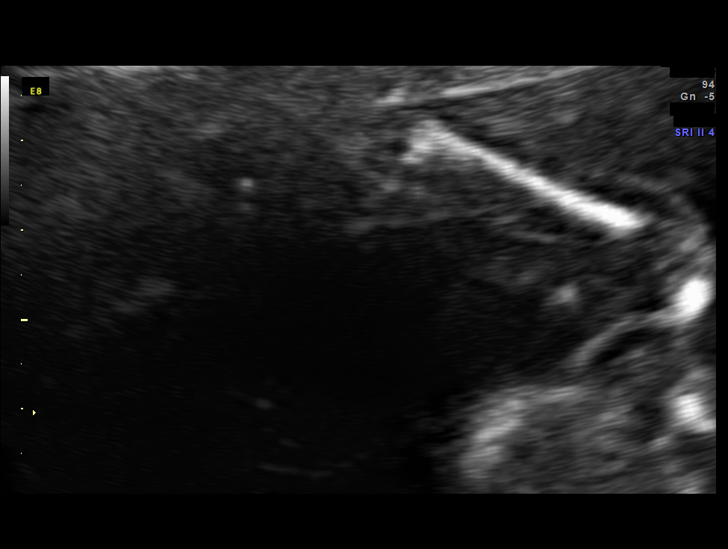
[im 36/97]
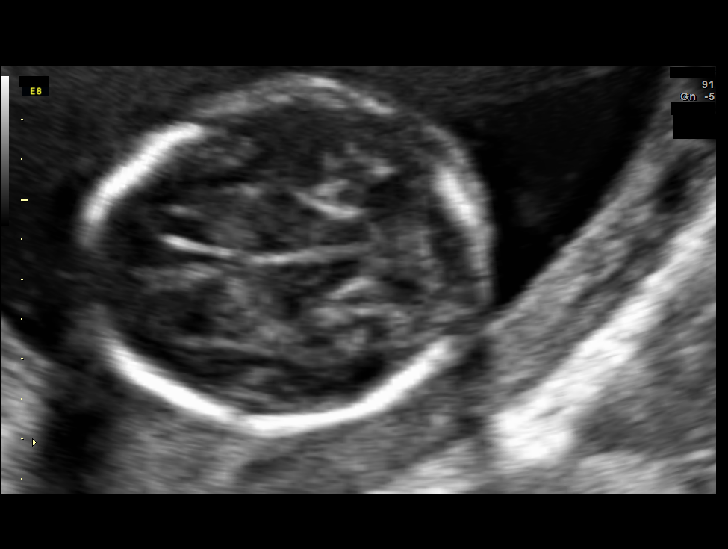
[im 43/97]
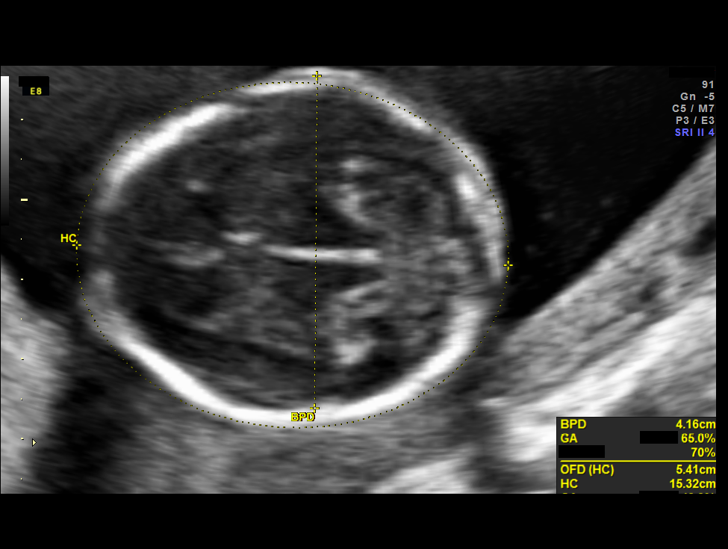
[im 54/97]
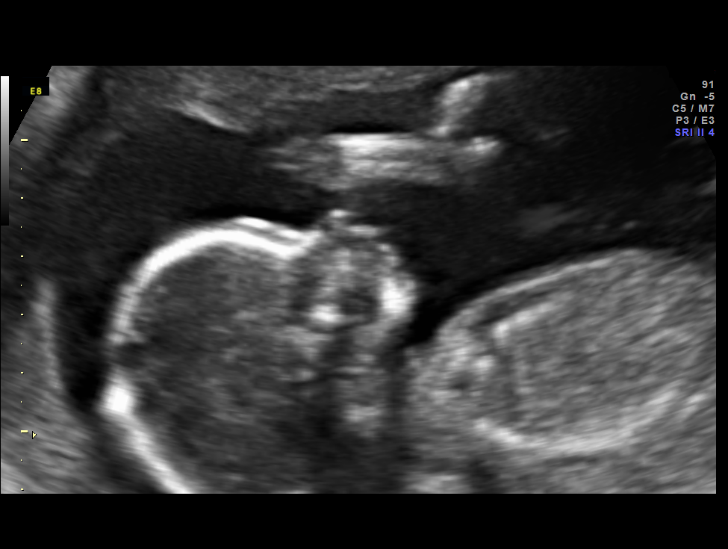
[im 61/97]
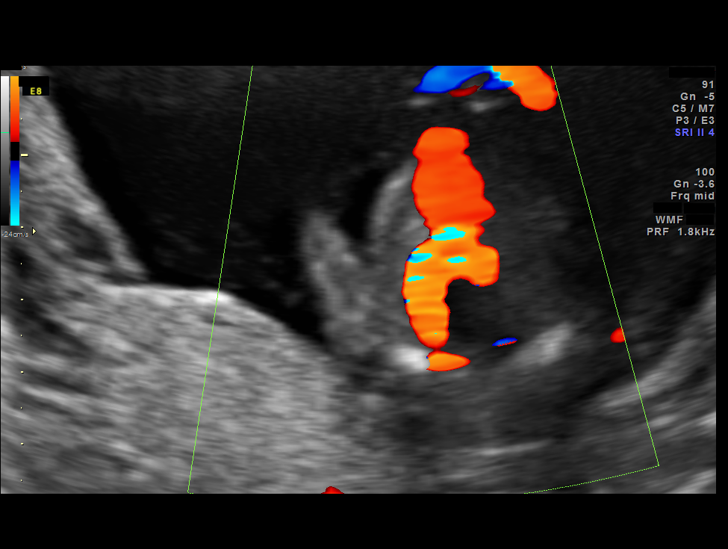
[im 68/97]
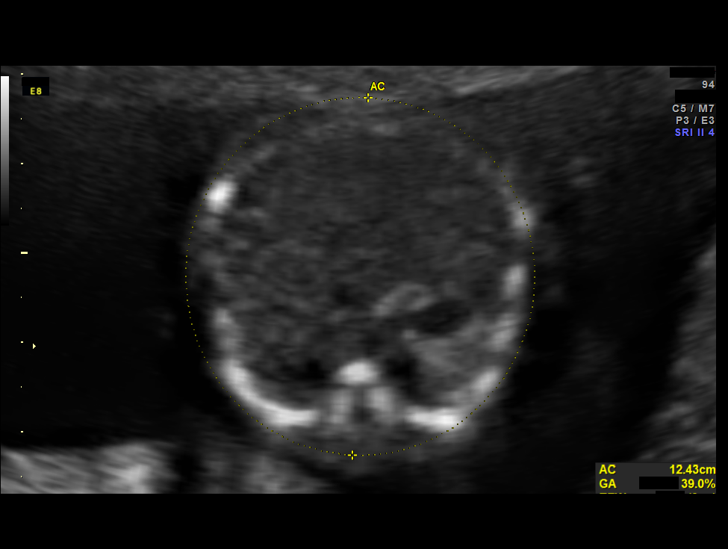
[im 79/97]
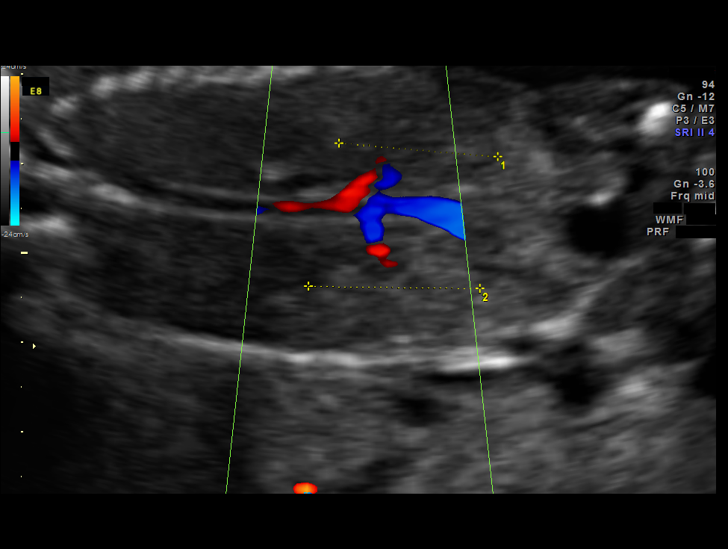
[im 86/97]
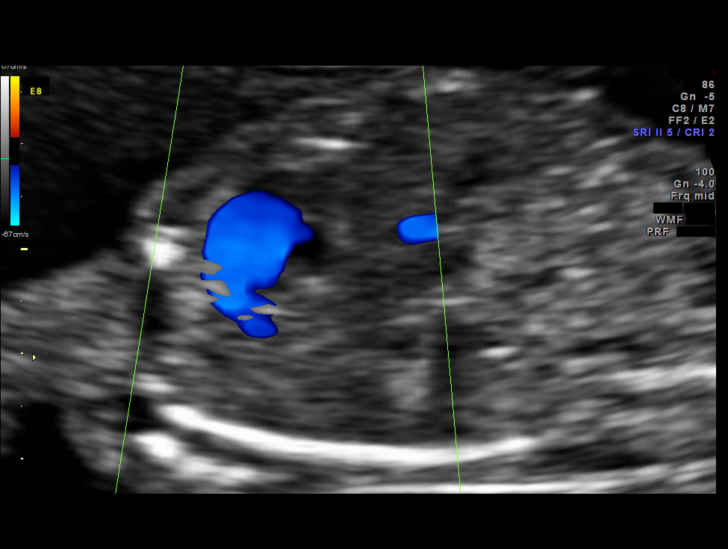
[im 93/97]
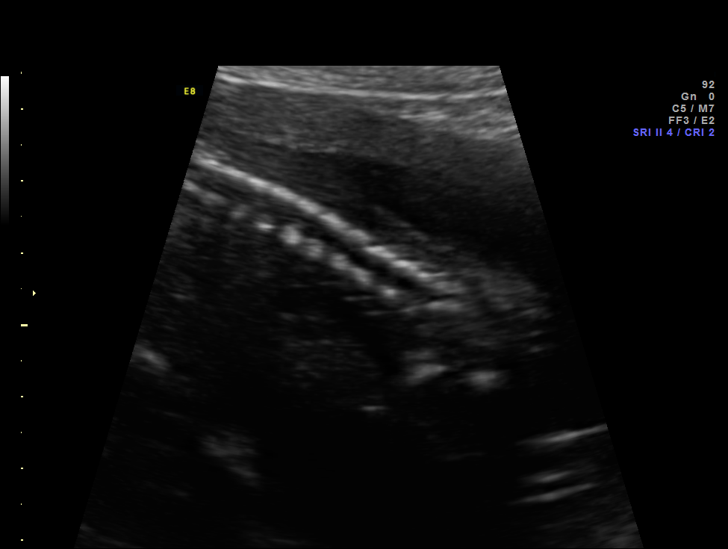

[12 of 28 positions shown; findings below may reference images not displayed]

OBSTETRICS REPORT
                      (Signed Final 04/05/2012 [DATE])

Service(s) Provided

 US OB DETAIL + 14 WK                                  76811.0
Indications

 Detailed fetal anatomic survey
 Advanced maternal age (AMA), Multigravida (41)
 Abnormal biochemical screen (quad) for Trisomy
 21
 Poor obstetric history: Previous IUFD (stillbirth)
 Previous cesarean section
 Poor obstetric history: Previous neonatal death
Fetal Evaluation

 Num Of Fetuses:    1
 Fetal Heart Rate:  144                          bpm
 Cardiac Activity:  Observed
 Presentation:      Variable
 Placenta:          Posterior, above cervical
                    os
 P. Cord            Visualized
 Insertion:

 Amniotic Fluid
 AFI FV:      Subjectively within normal limits
                                             Larg Pckt:     5.7  cm
Biometry

 BPD:     41.7  mm     G. Age:  18w 4d                CI:         75.8   70 - 86
 OFD:       55  mm                                    FL/HC:      18.8   15.8 -
                                                                         18
 HC:     154.6  mm     G. Age:  18w 3d       49  %    HC/AC:      1.21   1.07 -

 AC:     127.3  mm     G. Age:  18w 2d       48  %    FL/BPD:
 FL:        29  mm     G. Age:  18w 6d       67  %    FL/AC:      22.8   20 - 24
 HUM:     28.5  mm     G. Age:  19w 1d       80  %
 CER:     18.9  mm     G. Age:  18w 3d       55  %
 NFT:      2.7  mm

 Est. FW:     247  gm      0 lb 9 oz     52  %
Gestational Age
 LMP:           18w 2d        Date:  11/29/11                 EDD:   09/04/12
 U/S Today:     18w 4d                                        EDD:   09/02/12
 Best:          18w 2d     Det. By:  LMP  (11/29/11)          EDD:   09/04/12
Anatomy

 Cranium:          Appears normal         Aortic Arch:      Appears normal
 Fetal Cavum:      Appears normal         Ductal Arch:      Appears normal
 Ventricles:       Appears normal         Diaphragm:        Appears normal
 Choroid Plexus:   Appears normal         Stomach:          Appears normal, left
                                                            sided
 Cerebellum:       Appears normal         Abdomen:          Appears normal
 Posterior Fossa:  Appears normal         Abdominal Wall:   Appears nml (cord
                                                            insert, abd wall)
 Nuchal Fold:      Appears normal         Cord Vessels:     Appears normal (3
                                                            vessel cord)
 Face:             Appears normal         Kidneys:          Appear normal
                   (orbits and profile)
 Lips:             Appears normal         Bladder:          Appears normal
 Heart:            Appears normal         Spine:            Appears normal
                   (4CH, axis, and
                   situs)
 RVOT:             Appears normal         Lower             Appears normal
                                          Extremities:
 LVOT:             Appears normal         Upper             Appears normal
                                          Extremities:

 Other:  Fetus appears to be a female. Heels and 5th digit appear normal.
Targeted Anatomy

 Fetal Central Nervous System
 Cisterna Magna:
Cervix Uterus Adnexa

 Cervical Length:    4.3      cm

 Cervix:       Normal appearance by transabdominal scan. Appears
               closed, without funnelling.
 Left Ovary:    Not visualized. No adnexal mass visualized.
 Right Ovary:   Within normal limits.
Impression

 IUP at 18+2 weeks
 Normal detailed fetal anatomy
 Markers of aneuploidy: none
 Normal amniotic fluid volume
 Measurements consistent with LMP dating

 The US findings were shared with Ms. Gregorius. The
 implications of a normal detailed US on her T21 risk were
 discussed in detail. All of her prenatal testing options were
 reviewed. After careful consideration, she declined
 amniocentesis and cffDNA screening.
Recommendations

 Follow-up ultrasound for growth in 6 weeks

## 2014-12-27 IMAGING — US US OB FOLLOW-UP
1 series · 12 of 28 positions shown · non-contrast
Comparison: none

OBSTETRICS REPORT
                      (Signed Final 07/12/2012 [DATE])

Service(s) Provided
 US OB FOLLOW UP                                       76816.1
Indications
 Advanced maternal age (AMA), Multigravida (41)
 Abnormal biochemical screen (quad) for Trisomy
 21
 Poor obstetric history: Previous IUFD (stillbirth)
 Previous cesarean section
 Poor obstetric history: Previous neonatal death
 Polyhydramnios, mild
Fetal Evaluation
 Num Of Fetuses:    1
 Fetal Heart Rate:  126                          bpm
 Cardiac Activity:  Observed
 Presentation:      Cephalic
 Placenta:          Posterior, above cervical
                    os
 P. Cord            Previously Visualized
 Insertion:
 Amniotic Fluid
 AFI FV:      Subjectively within normal limits
 AFI Sum:     20.66   cm       79  %Tile     Larg Pckt:    6.64  cm
 RUQ:   6.16    cm   RLQ:    6.64   cm    LUQ:   3.85    cm   LLQ:    4.01   cm
Biometry
 BPD:       80  mm     G. Age:  32w 1d                CI:         75.0   70 - 86
 OFD:    106.6  mm                                    FL/HC:      21.1   19.1 -
 HC:     300.8  mm     G. Age:  33w 3d       42  %    HC/AC:      1.02   0.96 -
 AC:     295.5  mm     G. Age:  33w 4d       82  %    FL/BPD:     79.5   71 - 87
 FL:      63.6  mm     G. Age:  32w 6d       54  %    FL/AC:      21.5   20 - 24
 HUM:     57.3  mm     G. Age:  33w 2d       73  %
 Est. FW:    9498  gm    4 lb 11 oz      75  %
Gestational Age
 LMP:           32w 2d        Date:  11/29/11                 EDD:   09/04/12
 U/S Today:     33w 0d                                        EDD:   08/30/12
 Best:          32w 2d     Det. By:  LMP  (11/29/11)          EDD:   09/04/12
Anatomy
 Cranium:          Previously seen        Aortic Arch:      Previously seen
 Fetal Cavum:      Previously seen        Ductal Arch:      Previously seen
 Ventricles:       Appears normal         Diaphragm:        Appears normal
 Choroid Plexus:   Previously seen        Stomach:          Appears normal, left
                                                            sided
 Cerebellum:       Previously seen        Abdomen:          Previously seen
 Posterior Fossa:  Previously seen        Abdominal Wall:   Previously seen
 Nuchal Fold:      Previously seen        Cord Vessels:     Previously seen
 Face:             Orbits and profile     Kidneys:          Appear normal
                   previously seen
 Lips:             Previously seen        Bladder:          Appears normal
 Heart:            Appears normal         Spine:            Not well visualized
                   (4CH, axis, and
                   situs)
 RVOT:             Previously seen        Lower             Previously seen
                                          Extremities:
 LVOT:             Previously seen        Upper             Previously seen
 Other:  Female gender. Heels and 5th digit previously seen.
Cervix Uterus Adnexa
 Cervix:       Not visualized (advanced GA >65wks)
Impression
INDICATION: 42 yr old [id number] at 54w4d with history of full
 term fetal demise and previous finding of polyhydramnios for
 follow up fetal growth ultrasound.

[Series 1: us ob follow-up · 12 of 35 slices shown]
[im 2/35]
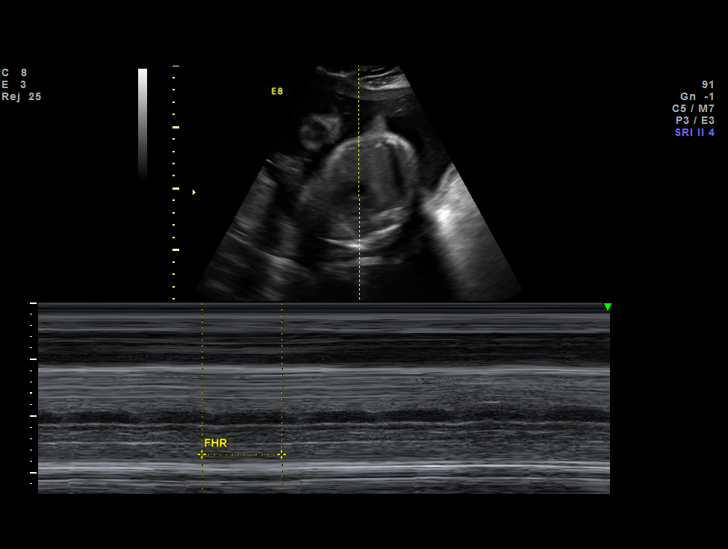
[im 4/35]
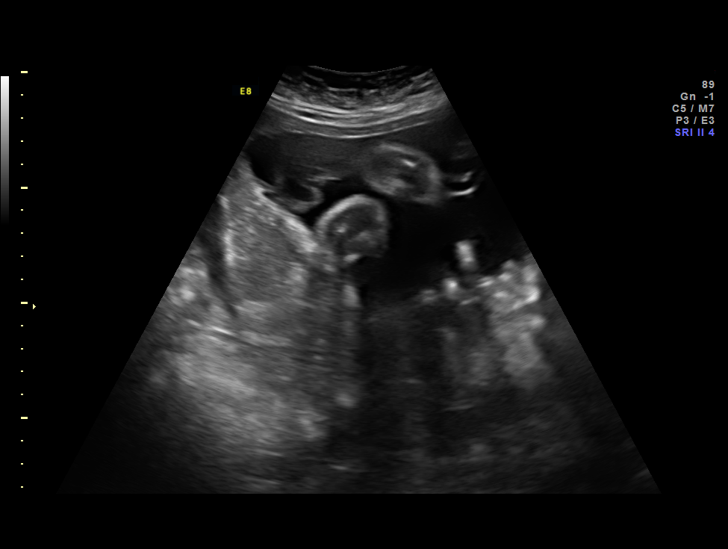
[im 7/35]
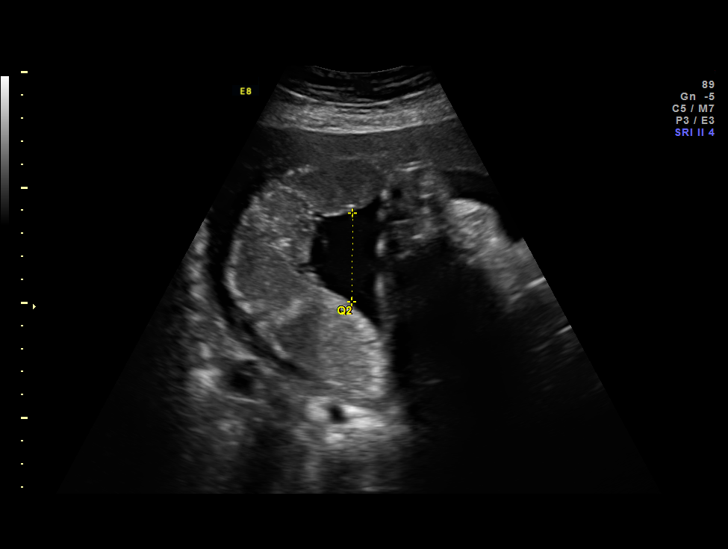
[im 11/35]
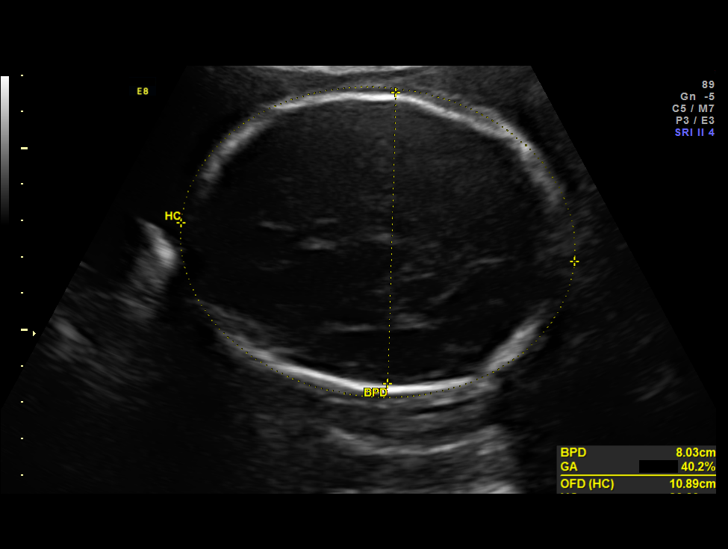
[im 13/35]
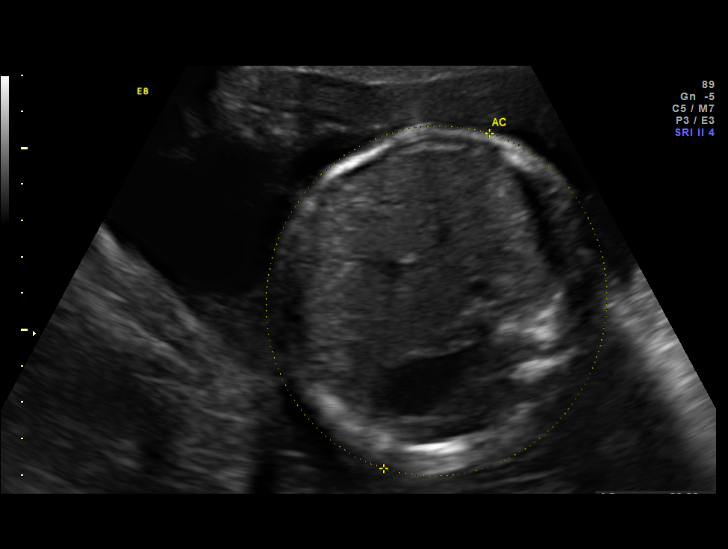
[im 16/35]
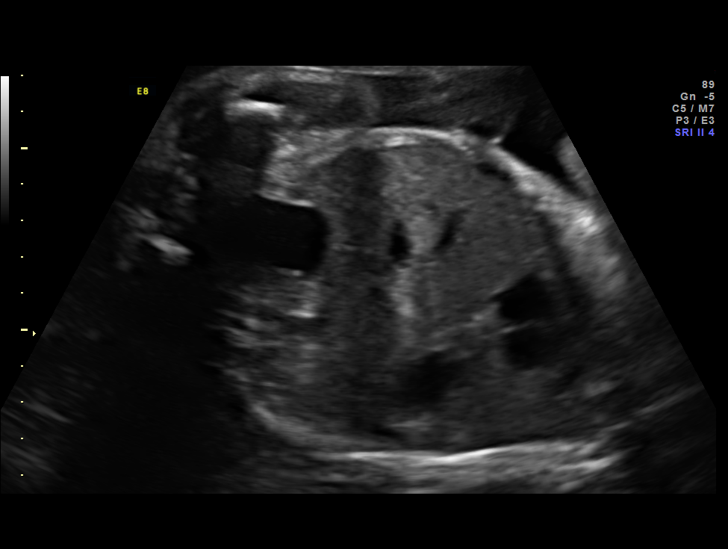
[im 19/35]
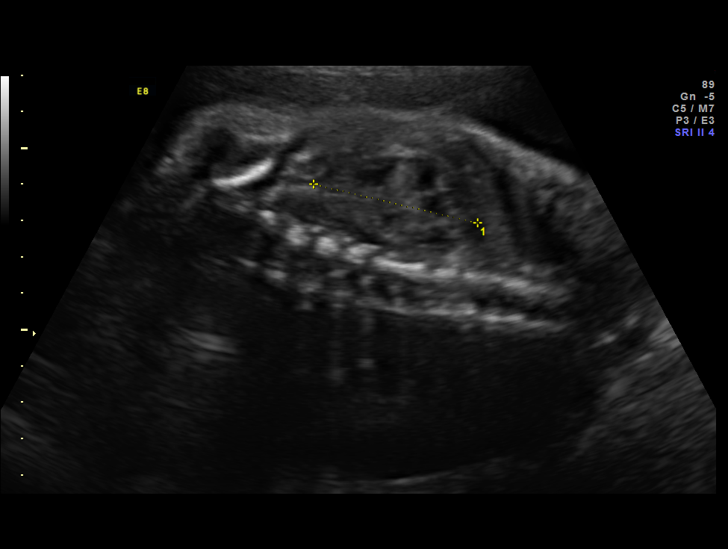
[im 22/35]
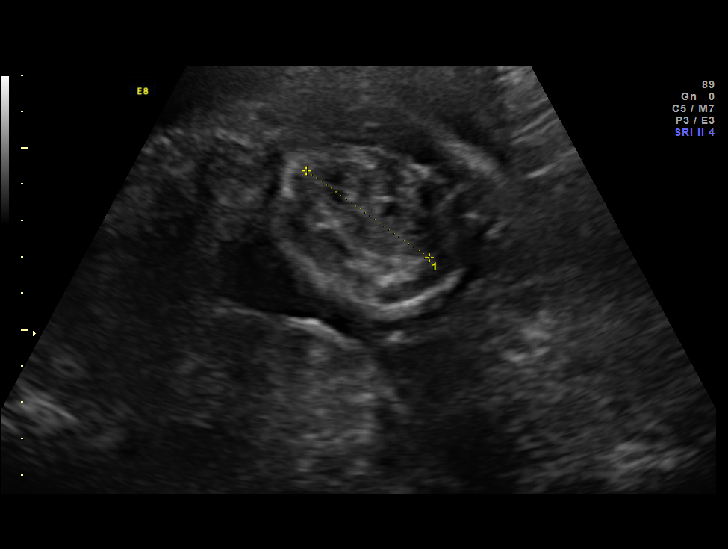
[im 24/35]
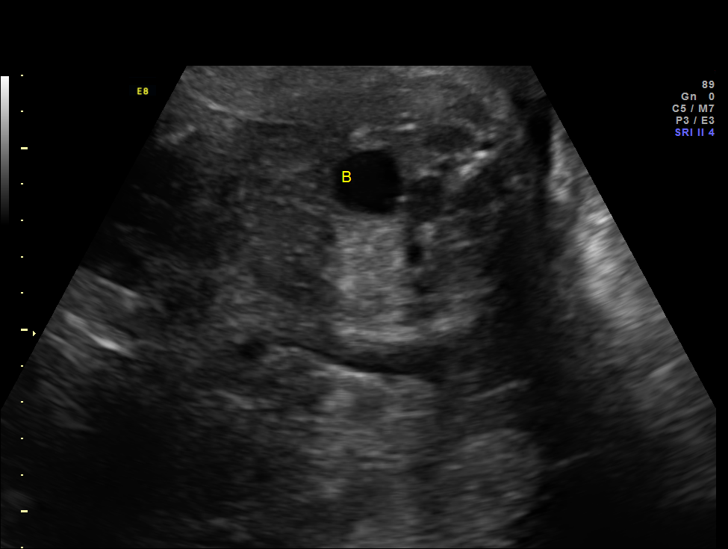
[im 28/35]
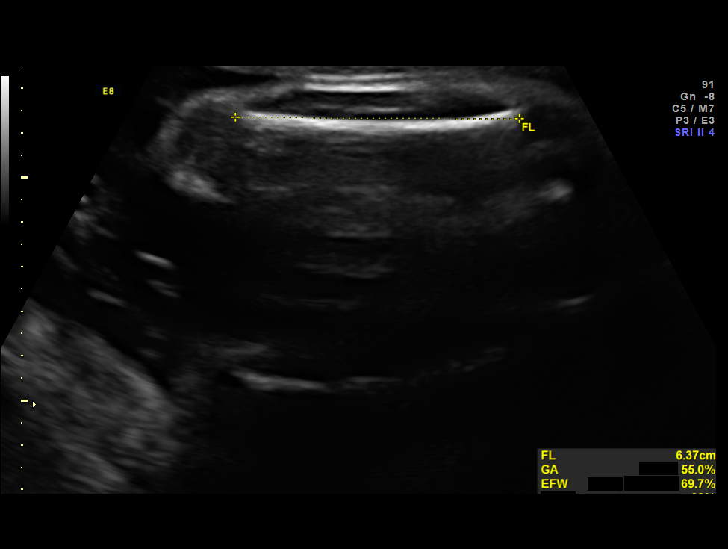
[im 31/35]
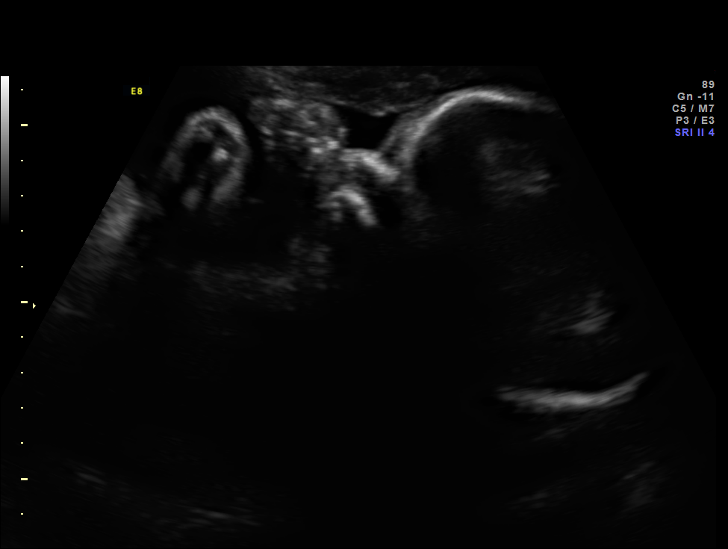
[im 33/35]
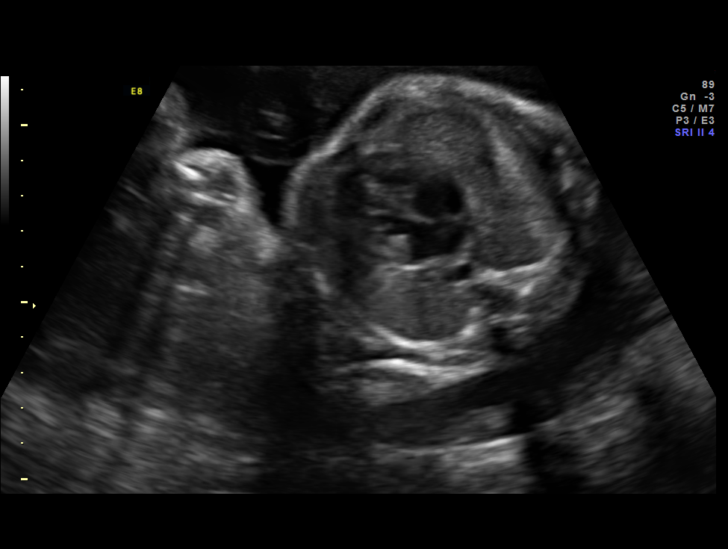

[12 of 28 positions shown; findings below may reference images not displayed]

FINDINGS: 1. Single intrauterine pregnancy.
 2. Estimated fetal weight is in the 75th%.
 3. Posterior placenta without evidence of previa.
 4. Normal amniotic fluid index; although slightly increased for
 gestational age.
 5. The limited anatomy survey is normal. The bowel appeared
 mildly dilated on today's exam.
Recommendations

 1. Appropriate fetal growth.
 2. Previous finding of polyhydramnios:
 - previously counseled
 - amniotic fluid index normal
 - recommend continue antenatal testing with twice weekly
 nonstress tests and weekly amniotic fluid index
 3. Advanced maternal age:
 - previously counseled
 - had quad screen which showed risk of trisomy 2[REDACTED]
 which is screen positive but improved over her age related
 risk
 - patient declined cell free fetal DNA or amniocentesis
 - recommend continue antenatal testing
 - recommend delivery by estimated due date
 4. History of fetal demise:
 - previously counseled
 - I am not aware of the etiology or what work up has been
 done
 I recommend fetal Mak Zmeya Japharov instructions were given and
 patient is to call with decreased fetal movement.
 I do not recommend early delivery based on this history.
 Recommend delivery at 39-40 weeks gestation in the
 absence of other complications. If patient has significant
 anxiety she can be delivered after 37 weeks with an
 amniocentesis showing fetal lung maturity.
 5. Recommend ultrasound for fetal growth in 4 weeks. Will
 reevaluate fetal bowel at that time.

## 2015-07-11 ENCOUNTER — Encounter (HOSPITAL_BASED_OUTPATIENT_CLINIC_OR_DEPARTMENT_OTHER): Payer: Self-pay | Admitting: Emergency Medicine

## 2015-07-11 ENCOUNTER — Emergency Department (HOSPITAL_BASED_OUTPATIENT_CLINIC_OR_DEPARTMENT_OTHER)
Admission: EM | Admit: 2015-07-11 | Discharge: 2015-07-11 | Disposition: A | Discharge status: Home / Self Care | Payer: Self-pay | Attending: Emergency Medicine | Admitting: Emergency Medicine

## 2015-07-11 ENCOUNTER — Emergency Department (HOSPITAL_BASED_OUTPATIENT_CLINIC_OR_DEPARTMENT_OTHER): Payer: Self-pay

## 2015-07-11 DIAGNOSIS — Z8719 Personal history of other diseases of the digestive system: Secondary | ICD-10-CM | POA: Insufficient documentation

## 2015-07-11 DIAGNOSIS — R69 Illness, unspecified: Secondary | ICD-10-CM

## 2015-07-11 DIAGNOSIS — Z8639 Personal history of other endocrine, nutritional and metabolic disease: Secondary | ICD-10-CM | POA: Insufficient documentation

## 2015-07-11 DIAGNOSIS — Z87891 Personal history of nicotine dependence: Secondary | ICD-10-CM | POA: Insufficient documentation

## 2015-07-11 DIAGNOSIS — J111 Influenza due to unidentified influenza virus with other respiratory manifestations: Secondary | ICD-10-CM | POA: Insufficient documentation

## 2015-07-11 DIAGNOSIS — Z791 Long term (current) use of non-steroidal anti-inflammatories (NSAID): Secondary | ICD-10-CM | POA: Insufficient documentation

## 2015-07-11 MED ORDER — BENZONATATE 100 MG PO CAPS: 100.0000 mg | ORAL_CAPSULE | Freq: Three times a day (TID) | ORAL | Status: DC

## 2015-07-11 MED ORDER — ACETAMINOPHEN 500 MG PO TABS
1000.0000 mg | ORAL_TABLET | Freq: Once | ORAL | Status: AC | PRN
  Administered 2015-07-11: 1000 mg via ORAL
  Filled 2015-07-11: qty 2

## 2015-07-11 NOTE — ED Provider Notes (Signed)
CSN: [id number]     Arrival date & time 07/11/15  1653 History   First MD Initiated Contact with Patient 07/11/15 1755     Chief Complaint  Patient presents with  . Cough  . Generalized Body Aches     (Consider location/radiation/quality/duration/timing/severity/associated sxs/prior Treatment) HPI Comments: Patient presents with complaint of fever, cough, body aches, chills, nausea starting yesterday. No known sick contacts. Treating at home with over-the-counter medications without much relief. Notes increased fatigue. No nausea, vomiting, diarrhea or abdominal pain. No chest pain. Cough is not productive. No urinary symptoms. Onset of symptoms acute. Course is constant. Nothing makes symptoms better or worse.  Patient is a 45 y.o. female presenting with cough. The history is provided by the patient.  Cough Associated symptoms: chills, fever and myalgias   Associated symptoms: no ear pain, no headaches, no rash, no rhinorrhea, no shortness of breath, no sore throat and no wheezing     Past Medical History  Diagnosis Date  . Thyroid disease     hyper and hypo  . Hyperthyroidism   . Hypothyroidism   . GERD (gastroesophageal reflux disease)    Past Surgical History  Procedure Laterality Date  . Cesarean section    . Dilation and curettage of uterus    . Tubal ligation Bilateral 08/22/2012    Procedure: BILATERAL TUBAL LIGATION;  Surgeon: Antionette Char, MD;  Location: [facility];  Service: Obstetrics;  Laterality: Bilateral;  . Cesarean section N/A 08/22/2012    Procedure: CESAREAN SECTION;  Surgeon: Antionette Char, MD;  Location: [facility];  Service: Obstetrics;  Laterality: N/A;   Family History  Problem Relation Age of Onset  . Hypertension Mother   . Diabetes Father   . Cancer Father   . Hyperlipidemia Neg Hx   . Heart attack Neg Hx   . Sudden death Neg Hx    Social History  Substance Use Topics  . Smoking status: Former Games developer  . Smokeless tobacco: Never Used  .  Alcohol Use: No   OB History    Gravida Para Term Preterm AB TAB SAB Ectopic Multiple Living   0 2     Review of Systems  Constitutional: Positive for fever, chills and fatigue.  HENT: Negative for congestion, ear pain, rhinorrhea, sinus pressure and sore throat.   Eyes: Negative for redness.  Respiratory: Positive for cough. Negative for shortness of breath and wheezing.   Gastrointestinal: Positive for nausea. Negative for vomiting, abdominal pain and diarrhea.  Genitourinary: Negative for dysuria.  Musculoskeletal: Positive for myalgias. Negative for neck stiffness.  Skin: Negative for rash.  Neurological: Negative for headaches.  Hematological: Negative for adenopathy.    Allergies  Condoms - female  Home Medications   Prior to Admission medications   Medication Sig Start Date End Date Taking? Authorizing Provider  diclofenac (VOLTAREN) 75 MG EC tablet Take 1 tablet (75 mg total) by mouth 2 (two) times daily. 02/13/13   Lenda Kelp, MD  meloxicam (MOBIC) 15 MG tablet Take 1 tablet (15 mg total) by mouth daily. With food. 02/27/13   Lenda Kelp, MD  traMADol (ULTRAM) 50 MG tablet Take 1 tablet (50 mg total) by mouth every 8 (eight) hours. 04/08/13   Lenda Kelp, MD   BP 150/95 mmHg  Pulse 95  Temp(Src) 102.3 F (39.1 C) (Oral)  Resp 20  Ht  (1.676 m)  Wt 99.338 kg  BMI 35.36 kg/m2  SpO2 97%  Physical Exam  Constitutional: She appears well-developed and well-nourished.  HENT:  Head: Normocephalic and atraumatic.  Right Ear: Tympanic membrane, external ear and ear canal normal.  Left Ear: Tympanic membrane, external ear and ear canal normal.  Nose: No rhinorrhea.  Mouth/Throat: Posterior oropharyngeal erythema present. No oropharyngeal exudate, posterior oropharyngeal edema or tonsillar abscesses.  Eyes: Conjunctivae are normal. Right eye exhibits no discharge. Left eye exhibits no discharge.  Neck: Normal range of motion. Neck supple.   Cardiovascular: Normal rate, regular rhythm and normal heart sounds.   No murmur heard. Pulmonary/Chest: Effort normal and breath sounds normal. No respiratory distress. She has no wheezes. She has no rales.  Abdominal: Soft. There is no tenderness.  Neurological: She is alert.  Skin: Skin is warm and dry.  Psychiatric: She has a normal mood and affect.  Nursing note and vitals reviewed.   ED Course  Procedures (including critical care time) Labs Review Labs Reviewed - No data to display  Imaging Review Dg Chest 2 View  07/11/2015  CLINICAL DATA:  Cough and congestion with body aches and fever several days. EXAM: CHEST  2 VIEW COMPARISON:  06/07/2010 FINDINGS: Lungs are adequately inflated without consolidation or effusion. Borderline stable cardiomegaly. Bones soft tissues are within normal. IMPRESSION: No active cardiopulmonary disease. Electronically Signed   By: Elberta Fortis M.D.   On: 07/11/2015 17:41   I have personally reviewed and evaluated these images and lab results as part of my medical decision-making.   EKG Interpretation None      6:48 PM Patient seen and examined.   Vital signs reviewed and are as follows: BP 150/95 mmHg  Pulse 95  Temp(Src) 102.3 F (39.1 C) (Oral)  Resp 20  Ht  (1.676 m)  Wt 99.338 kg  BMI 35.36 kg/m2  SpO2 97%  6:48 PM Patient discharged to home with rx tessalon. Encouraged to rest and drink plenty of fluids.  Patient told to return to ED or see their primary doctor if their symptoms worsen, high fever not controlled with tylenol, persistent vomiting, they feel they are dehydrated, or if they have any other concerns.  Patient verbalized understanding and agreed with plan.    MDM   Final diagnoses:  Influenza-like illness   Patient with symptoms consistent with influenza. Vitals are stable, low-grade fever. No signs of dehydration, tolerating PO's. Lungs are clear. Supportive therapy indicated with return if symptoms worsen.  Patient counseled.    Renne Crigler, PA-C 07/11/15 1906  Jerelyn Scott, MD 07/11/15 [phone]

## 2015-07-11 NOTE — Discharge Instructions (Signed)
Please read and follow all provided instructions.  Your diagnoses today include:  1. Influenza-like illness     Tests performed today include:  Chest x-ray - no pneumonia  Vital signs. See below for your results today.   Medications prescribed:   Tessalon Perles - cough suppressant medication  Take any prescribed medications only as directed.  Home care instructions:  Follow any educational materials contained in this packet. Please continue drinking plenty of fluids. Use over-the-counter cold and flu medications as needed as directed on packaging for symptom relief. You may also use ibuprofen or tylenol as directed on packaging for pain or fever.   BE VERY CAREFUL not to take multiple medicines containing Tylenol (also called acetaminophen). Doing so can lead to an overdose which can damage your liver and cause liver failure and possibly death.   Follow-up instructions: Please follow-up with your primary care provider in the next 3 days for further evaluation of your symptoms.   Return instructions:   Please return to the Emergency Department if you experience worsening symptoms.  Please return if you have a high fever greater than 101 degrees not controlled with over-the-counter medications, persistent vomiting and cannot keep down fluids, or worsening trouble breathing.  Please return if you have any other emergent concerns.  Additional Information:  Your vital signs today were: BP 150/95 mmHg   Pulse 95   Temp(Src) 102.3 F (39.1 C) (Oral)   Resp 20   Ht  (1.676 m)   Wt 99.338 kg   BMI 35.36 kg/m2   SpO2 97% If your blood pressure (BP) was elevated above 135/85 this visit, please have this repeated by your doctor within one month.

## 2015-07-11 NOTE — ED Notes (Signed)
Pt in c/o cough, aches, and chills onset last night. Noted to be mildly hoarse.

## 2016-08-21 ENCOUNTER — Emergency Department (HOSPITAL_COMMUNITY)
Admission: EM | Admit: 2016-08-21 | Discharge: 2016-08-21 | Disposition: A | Discharge status: Home / Self Care | Payer: Medicaid Other | Attending: Emergency Medicine | Admitting: Emergency Medicine

## 2016-08-21 ENCOUNTER — Encounter (HOSPITAL_COMMUNITY): Payer: Self-pay

## 2016-08-21 DIAGNOSIS — Z79899 Other long term (current) drug therapy: Secondary | ICD-10-CM | POA: Insufficient documentation

## 2016-08-21 DIAGNOSIS — Z87891 Personal history of nicotine dependence: Secondary | ICD-10-CM | POA: Insufficient documentation

## 2016-08-21 DIAGNOSIS — M545 Low back pain, unspecified: Secondary | ICD-10-CM

## 2016-08-21 DIAGNOSIS — E039 Hypothyroidism, unspecified: Secondary | ICD-10-CM | POA: Insufficient documentation

## 2016-08-21 LAB — URINALYSIS, ROUTINE W REFLEX MICROSCOPIC
Bilirubin Urine: NEGATIVE
GLUCOSE, UA: NEGATIVE mg/dL
Hgb urine dipstick: NEGATIVE
Ketones, ur: NEGATIVE mg/dL
LEUKOCYTES UA: NEGATIVE
Nitrite: NEGATIVE
PH: 7 (ref 5.0–8.0)
PROTEIN: NEGATIVE mg/dL
SPECIFIC GRAVITY, URINE: 1.01 (ref 1.005–1.030)

## 2016-08-21 LAB — POC URINE PREG, ED: Preg Test, Ur: NEGATIVE

## 2016-08-21 MED ORDER — KETOROLAC TROMETHAMINE 60 MG/2ML IM SOLN
60.0000 mg | Freq: Once | INTRAMUSCULAR | Status: AC
  Administered 2016-08-21: 60 mg via INTRAMUSCULAR
  Filled 2016-08-21: qty 2

## 2016-08-21 MED ORDER — METHOCARBAMOL 500 MG PO TABS: 500.0000 mg | ORAL_TABLET | Freq: Two times a day (BID) | ORAL | 0 refills | Status: DC

## 2016-08-21 MED ORDER — LIDOCAINE 5 % EX PTCH
1.0000 | MEDICATED_PATCH | Freq: Once | CUTANEOUS | Status: DC
  Administered 2016-08-21: 1 via TRANSDERMAL
  Filled 2016-08-21: qty 1

## 2016-08-21 MED ORDER — IBUPROFEN 600 MG PO TABS: 600.0000 mg | ORAL_TABLET | Freq: Four times a day (QID) | ORAL | 0 refills | Status: DC | PRN

## 2016-08-21 MED ORDER — LIDOCAINE 5 % EX PTCH: 1.0000 | MEDICATED_PATCH | CUTANEOUS | 0 refills | Status: DC

## 2016-08-21 NOTE — ED Provider Notes (Signed)
[facility]-EMERGENCY DEPT Provider Note   CSN: [id number] Arrival date & time: 08/21/16  4098     History   Chief Complaint Chief Complaint  Patient presents with  . Back Pain    HPI Danielle [name] is a 46 y.o. female.  HPI   Danielle [name] is a 46 y.o. female, with a history of back pain and GERD, presenting to the ED with lower back pain for the last 3-4 days. 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 States she woke up 3-4 days ago with soreness and stiffness, rated 8/10, nonradiating. States, "I don't know if I slept wrong or lifted wrong." She states she has had this type of pain before. Pt has not tried any treatments prior to arrival. Also endorses some nausea and one episode of vomiting.  Denies fever/chills, changes in bowel or bladder function, falls/known trauma, neuro deficits, abnormal vaginal discharge, abdominal pain, or any other complaints.   LMP 2-3 weeks ago.  Past Medical History:  Diagnosis Date  . GERD (gastroesophageal reflux disease)   . Hyperthyroidism   . Hypothyroidism   . Thyroid disease    hyper and hypo    Patient Active Problem List   Diagnosis Date Noted  . Fall 02/13/2013  . Unspecified high-risk pregnancy 08/13/2012    Past Surgical History:  Procedure Laterality Date  . CESAREAN SECTION    . CESAREAN SECTION N/A 08/22/2012   Procedure: CESAREAN SECTION;  Surgeon: Antionette Char, MD;  Location: [facility];  Service: Obstetrics;  Laterality: N/A;  . DILATION AND CURETTAGE OF UTERUS    . TUBAL LIGATION Bilateral 08/22/2012   Procedure: BILATERAL TUBAL LIGATION;  Surgeon: Antionette Char, MD;  Location: [facility];  Service: Obstetrics;  Laterality: Bilateral;    OB History    Gravida Para Term Preterm AB Living   SAB TAB Ectopic Multiple Live Births   0 2       Home Medications    Prior to Admission  medications   Medication Sig Start Date End Date Taking? Authorizing Provider  benzonatate (TESSALON) 100 MG capsule Take 1 capsule (100 mg total) by mouth every 8 (eight) hours. 07/11/15   Renne Crigler, PA-C  diclofenac (VOLTAREN) 75 MG EC tablet Take 1 tablet (75 mg total) by mouth 2 (two) times daily. 02/13/13   Lenda Kelp, MD  ibuprofen (ADVIL,MOTRIN) 600 MG tablet Take 1 tablet (600 mg total) by mouth every 6 (six) hours as needed. 08/21/16   France Noyce C Kennith Morss, PA-C  lidocaine (LIDODERM) 5 % Place 1 patch onto the skin daily. Remove & Discard patch within 12 hours or as directed by MD 08/21/16   Anselm Pancoast, PA-C  meloxicam (MOBIC) 15 MG tablet Take 1 tablet (15 mg total) by mouth daily. With food. 02/27/13   Lenda Kelp, MD  methocarbamol (ROBAXIN) 500 MG tablet Take 1 tablet (500 mg total) by mouth 2 (two) times daily. 08/21/16   Keelie Zemanek C Zamaria Brazzle, PA-C  traMADol (ULTRAM) 50 MG tablet Take 1 tablet (50 mg total) by mouth every 8 (eight) hours. 04/08/13   Lenda Kelp, MD    Family History Family History  Problem Relation Age of Onset  . Hypertension Mother   . Diabetes Father   . Cancer Father   . Hyperlipidemia Neg Hx   .  Heart attack Neg Hx   . Sudden death Neg Hx     Social History Social History  Substance Use Topics  . Smoking status: Former Games developer  . Smokeless tobacco: Never Used  . Alcohol use No     Allergies   Condoms - female [nonoxynol 9]   Review of Systems Review of Systems  Constitutional: Negative for fever.  Respiratory: Negative for shortness of breath.   Cardiovascular: Negative for chest pain.  Gastrointestinal: Positive for nausea. Negative for abdominal pain.  Genitourinary: Negative for dysuria, hematuria, pelvic pain, vaginal bleeding and vaginal discharge.  Musculoskeletal: Positive for back pain.  Neurological: Negative for dizziness, weakness, light-headedness, numbness and headaches.  All other systems reviewed and are negative.    Physical  Exam Updated Vital Signs BP (!) 158/99 (BP Location: Left Arm)   Pulse 84   Temp 98.1 F (36.7 C) (Oral)   Resp 16   LMP 08/09/2016 (Within Days)   SpO2 100%   Physical Exam  Constitutional: She appears well-developed and well-nourished. No distress.  HENT:  Head: Normocephalic and atraumatic.  Eyes: Conjunctivae are normal.  Neck: Neck supple.  Cardiovascular: Normal rate, regular rhythm, normal heart sounds and intact distal pulses.   Pulmonary/Chest: Effort normal and breath sounds normal. No respiratory distress.  Abdominal: Soft. There is no tenderness. There is no guarding.  Musculoskeletal: She exhibits no edema.  Tenderness to the bilateral lumbar paraspinal muscles. Normal motor function intact in all extremities and spine. No midline spinal tenderness.   Neurological: She is alert.  No sensory deficits. Strength 5/5 in both lower extremities. No gait disturbance. Coordination intact.  Skin: Skin is warm and dry. She is not diaphoretic.  Psychiatric: She has a normal mood and affect. Her behavior is normal.  Nursing note and vitals reviewed.    ED Treatments / Results  Labs (all labs ordered are listed, but only abnormal results are displayed) Labs Reviewed  URINALYSIS, ROUTINE W REFLEX MICROSCOPIC - Abnormal; Notable for the following:       Result Value   Color, Urine STRAW (*)    All other components within normal limits  POC URINE PREG, ED    EKG  EKG Interpretation None       Radiology No results found.  Procedures Procedures (including critical care time)  Medications Ordered in ED Medications  lidocaine (LIDODERM) 5 % 1 patch (1 patch Transdermal Patch Applied 08/21/16 0916)  ketorolac (TORADOL) injection 60 mg (60 mg Intramuscular Given 08/21/16 0852)     Initial Impression / Assessment and Plan / ED Course  I have reviewed the triage vital signs and the nursing notes.  Pertinent labs & imaging results that were available during my care of  the patient were reviewed by me and considered in my medical decision making (see chart for details).     Patient presents with lower back pain for the last 3-4 days. She has no neuro or functional deficits. No red flag symptoms. Suspect muscular origin of pain. No abnormalities noted on UA. PCP follow-up recommended. The patient was given instructions for home care as well as return precautions. Patient voices understanding of these instructions, accepts the plan, and is comfortable with discharge.     Final Clinical Impressions(s) / ED Diagnoses   Final diagnoses:  Acute bilateral low back pain without sciatica    New Prescriptions New Prescriptions   IBUPROFEN (ADVIL,MOTRIN) 600 MG TABLET    Take 1 tablet (600 mg total) by mouth every 6 (  six) hours as needed.   LIDOCAINE (LIDODERM) 5 %    Place 1 patch onto the skin daily. Remove & Discard patch within 12 hours or as directed by MD   METHOCARBAMOL (ROBAXIN) 500 MG TABLET    Take 1 tablet (500 mg total) by mouth 2 (two) times daily.     Anselm Pancoast, PA-C 08/21/16 1115    Loren Racer, MD 08/23/16 [phone]

## 2016-08-21 NOTE — Discharge Instructions (Signed)
There were no abnormalities in the urine. Take it easy, but do not lay around too much as this may make any stiffness worse.  Antiinflammatory medications: Take 500 mg of naproxen every 12 hours or 600 mg of ibuprofen every 6 hours for the next 3 days. Take these medications with food to avoid upset stomach. Choose only one of these medications, do not take them together.  Muscle relaxer: Robaxin is a muscle relaxer and may help loosen stiff muscles. Do not take the Robaxin while driving or performing other dangerous activities.   Lidocaine patches: These are available via either prescription or over-the-counter. The over-the-counter option may be more economical one and are likely just as effective. There are multiple over-the-counter brands, such as Salonpas.  Exercises: Be sure to perform the attached exercises starting with three times a week and working up to performing them daily. This is an essential part of preventing long term problems.   Follow up with a primary care provider for any future management of these complaints.

## 2016-08-21 NOTE — ED Notes (Signed)
Pt reported that she just used the bathroom and she was unable to give a urine sample. Pt given water and reported to give sample as soon as possible.

## 2016-08-21 NOTE — ED Triage Notes (Signed)
Pt states lower back pain X2 days. Denies any numbness or loss of sensation in either of her lower extremities. Pain not related to an injury. Pt ambulatory to triage.

## 2017-06-10 ENCOUNTER — Encounter (HOSPITAL_COMMUNITY): Payer: Self-pay

## 2017-06-10 ENCOUNTER — Other Ambulatory Visit: Payer: Self-pay

## 2017-06-10 ENCOUNTER — Emergency Department (HOSPITAL_COMMUNITY)
Admission: EM | Admit: 2017-06-10 | Discharge: 2017-06-10 | Disposition: A | Discharge status: Home / Self Care | Payer: Self-pay | Attending: Emergency Medicine | Admitting: Emergency Medicine

## 2017-06-10 DIAGNOSIS — F17210 Nicotine dependence, cigarettes, uncomplicated: Secondary | ICD-10-CM | POA: Insufficient documentation

## 2017-06-10 DIAGNOSIS — E039 Hypothyroidism, unspecified: Secondary | ICD-10-CM | POA: Insufficient documentation

## 2017-06-10 DIAGNOSIS — E0590 Thyrotoxicosis, unspecified without thyrotoxic crisis or storm: Secondary | ICD-10-CM | POA: Insufficient documentation

## 2017-06-10 DIAGNOSIS — M5442 Lumbago with sciatica, left side: Secondary | ICD-10-CM | POA: Insufficient documentation

## 2017-06-10 LAB — URINALYSIS, ROUTINE W REFLEX MICROSCOPIC
Bacteria, UA: NONE SEEN
Bilirubin Urine: NEGATIVE
GLUCOSE, UA: NEGATIVE mg/dL
HGB URINE DIPSTICK: NEGATIVE
Ketones, ur: NEGATIVE mg/dL
NITRITE: NEGATIVE
PROTEIN: NEGATIVE mg/dL
Specific Gravity, Urine: 1.029 (ref 1.005–1.030)
pH: 5 (ref 5.0–8.0)

## 2017-06-10 MED ORDER — NAPROXEN 500 MG PO TABS: 500.0000 mg | ORAL_TABLET | Freq: Two times a day (BID) | ORAL | 0 refills | Status: DC

## 2017-06-10 MED ORDER — LIDOCAINE 5 % EX PTCH: 1.0000 | MEDICATED_PATCH | CUTANEOUS | 0 refills | Status: DC

## 2017-06-10 MED ORDER — METHOCARBAMOL 500 MG PO TABS: 500.0000 mg | ORAL_TABLET | Freq: Two times a day (BID) | ORAL | 0 refills | Status: DC

## 2017-06-10 NOTE — ED Provider Notes (Signed)
[facility]   CSN: [id number] Arrival date & time: 06/10/17  1632     History   Chief Complaint Chief Complaint  Patient presents with  . Back Pain    HPI Danielle [name] is a 47 y.o. female.  HPI   Danielle [name] is a 47 y.o. female, with a history of thyroid disease and GERD, presenting to the ED with left lower back pain beginning 2-3 days ago.  Describes the pain as a stiffness with intermittent spasms, currently moderate, intermittently radiating down the left leg.  States she has a job that involves a lot of bending, lifting, and twisting.  She has had similar pain in the past.  Denies numbness, weakness, changes in bowel or bladder function, dysuria, hematuria, abdominal pain, fever/chills, falls/trauma, saddle anesthesias, or any other complaints.    Past Medical History:  Diagnosis Date  . GERD (gastroesophageal reflux disease)   . Hyperthyroidism   . Hypothyroidism   . Thyroid disease    hyper and hypo    Patient Active Problem List   Diagnosis Date Noted  . Fall 02/13/2013  . Unspecified high-risk pregnancy 08/13/2012    Past Surgical History:  Procedure Laterality Date  . CESAREAN SECTION    . CESAREAN SECTION N/A 08/22/2012   Procedure: CESAREAN SECTION;  Surgeon: Antionette CharLisa Jackson-Moore, MD;  Location: [facility];  Service: Obstetrics;  Laterality: N/A;  . DILATION AND CURETTAGE OF UTERUS    . TUBAL LIGATION Bilateral 08/22/2012   Procedure: BILATERAL TUBAL LIGATION;  Surgeon: Antionette CharLisa Jackson-Moore, MD;  Location: [facility];  Service: Obstetrics;  Laterality: Bilateral;    OB History    Gravida Para Term Preterm AB Living   8 4 4   4 2    SAB TAB Ectopic Multiple Live Births   1 2 1  0 2       Home Medications    Prior to Admission medications   Medication Sig Start Date End Date Taking? Authorizing Provider  lidocaine (LIDODERM) 5 % Place 1 patch onto the skin daily. Remove & Discard patch within 12 hours  or as directed by MD 06/10/17   Joy, Shawn C, PA-C  methocarbamol (ROBAXIN) 500 MG tablet Take 1 tablet (500 mg total) by mouth 2 (two) times daily. 06/10/17   Joy, Shawn C, PA-C  naproxen (NAPROSYN) 500 MG tablet Take 1 tablet (500 mg total) by mouth 2 (two) times daily. 06/10/17   Joy, Shawn C, PA-C  traMADol (ULTRAM) 50 MG tablet Take 1 tablet (50 mg total) by mouth every 8 (eight) hours. 04/08/13   Lenda KelpHudnall, Shane R, MD    Family History Family History  Problem Relation Age of Onset  . Hypertension Mother   . Diabetes Father   . Cancer Father   . Hyperlipidemia Neg Hx   . Heart attack Neg Hx   . Sudden death Neg Hx     Social History Social History   Tobacco Use  . Smoking status: Current Every Day Smoker  . Smokeless tobacco: Never Used  Substance Use Topics  . Alcohol use: No  . Drug use: No     Allergies   Condoms - female [nonoxynol 9]   Review of Systems Review of Systems  Constitutional: Negative for chills and fever.  Gastrointestinal: Negative for abdominal pain, nausea and vomiting.  Genitourinary: Negative for dysuria, flank pain and hematuria.  Musculoskeletal: Positive for back pain.  Neurological: Negative for weakness and numbness.  Physical Exam Updated Vital Signs BP (!) 149/85 (BP Location: Right Arm)   Pulse 93   Temp 99 F (37.2 C) (Oral)   Resp 18   Ht 5\' 6"  (1.676 m)   Wt 94.8 kg (209 lb)   LMP 06/03/2017   SpO2 100%   BMI 33.73 kg/m   Physical Exam  Constitutional: She appears well-developed and well-nourished. No distress.  HENT:  Head: Normocephalic and atraumatic.  Eyes: Conjunctivae are normal.  Neck: Neck supple.  Cardiovascular: Normal rate, regular rhythm and intact distal pulses.  Pulmonary/Chest: Effort normal. No respiratory distress.  Abdominal: Soft. There is no tenderness. There is no guarding.  Musculoskeletal: She exhibits tenderness. She exhibits no edema.       Back:  Tenderness to left lower back into the  left buttocks.   Range of motion through the cardinal directions of the left hip intact.  Patient ambulatory without assistance.  Lymphadenopathy:    She has no cervical adenopathy.  Neurological: She is alert.  No noted acute sensory deficits. Strength 5/5 with flexion and extension at the bilateral hips, knees, and ankles. No noted gait deficit. Coordination intact with heel to shin testing.  Skin: Skin is warm and dry. She is not diaphoretic.  Psychiatric: She has a normal mood and affect. Her behavior is normal.  Nursing note and vitals reviewed.    ED Treatments / Results  Labs (all labs ordered are listed, but only abnormal results are displayed) Labs Reviewed  URINALYSIS, ROUTINE W REFLEX MICROSCOPIC - Abnormal; Notable for the following components:      Result Value   APPearance HAZY (*)    Leukocytes, UA SMALL (*)    Squamous Epithelial / LPF 6-30 (*)    All other components within normal limits    EKG  EKG Interpretation None       Radiology No results found.  Procedures Procedures (including critical care time)  Medications Ordered in ED Medications - No data to display   Initial Impression / Assessment and Plan / ED Course  I have reviewed the triage vital signs and the nursing notes.  Pertinent labs & imaging results that were available during my care of the patient were reviewed by me and considered in my medical decision making (see chart for details).     Patient presents with lower back pain with elements of possible sciatica.  She has had similar pain in the past.  No neuro or functional deficits. PCP follow up. The patient was given instructions for home care as well as return precautions. Patient voices understanding of these instructions, accepts the plan, and is comfortable with discharge.     Final Clinical Impressions(s) / ED Diagnoses   Final diagnoses:  Acute left-sided low back pain with left-sided sciatica    ED Discharge  Orders        Ordered    naproxen (NAPROSYN) 500 MG tablet  2 times daily     06/10/17 1808    methocarbamol (ROBAXIN) 500 MG tablet  2 times daily     06/10/17 1808    lidocaine (LIDODERM) 5 %  Every 24 hours     06/10/17 1808       Anselm Pancoast, PA-C 06/10/17 1839    Rolland Porter, MD 06/10/17 2146

## 2017-06-10 NOTE — Discharge Instructions (Signed)
Take it easy, but do not lay around too much as this may make any stiffness worse.  °Antiinflammatory medications: Take 600 mg of ibuprofen every 6 hours or 440 mg (over the counter dose) to 500 mg (prescription dose) of naproxen every 12 hours for the next 3 days. After this time, these medications may be used as needed for pain. Take these medications with food to avoid upset stomach. Choose only one of these medications, do not take them together.  °Tylenol: Should you continue to have additional pain while taking the ibuprofen or naproxen, you may add in tylenol as needed. Your daily total maximum amount of tylenol from all sources should be limited to 4000mg/day for persons without liver problems, or 2000mg/day for those with liver problems. °Muscle relaxer: Robaxin is a muscle relaxer and may help loosen stiff muscles. Do not take the Robaxin while driving or performing other dangerous activities.  °Lidocaine patches: These are available via either prescription or over-the-counter. The over-the-counter option may be more economical one and are likely just as effective. There are multiple over-the-counter brands, such as Salonpas. °Exercises: Be sure to perform the attached exercises starting with three times a week and working up to performing them daily. This is an essential part of preventing long term problems.  ° °Follow up with a primary care provider for any future management of these complaints. °

## 2017-06-10 NOTE — ED Notes (Signed)
Patient still with pain but says she will get RX filled on way home.

## 2017-06-10 NOTE — ED Triage Notes (Addendum)
Pt c/o pain at left lower back x 2 days. No known injury. Denies any difficulty urinating. Pt states improved with motrin 800mg .

## 2017-12-14 ENCOUNTER — Encounter (HOSPITAL_COMMUNITY): Payer: Self-pay | Admitting: Emergency Medicine

## 2017-12-14 ENCOUNTER — Emergency Department (HOSPITAL_COMMUNITY)
Admission: EM | Admit: 2017-12-14 | Discharge: 2017-12-14 | Disposition: A | Discharge status: Home / Self Care | Payer: Self-pay | Attending: Emergency Medicine | Admitting: Emergency Medicine

## 2017-12-14 ENCOUNTER — Other Ambulatory Visit: Payer: Self-pay

## 2017-12-14 DIAGNOSIS — L03115 Cellulitis of right lower limb: Secondary | ICD-10-CM | POA: Insufficient documentation

## 2017-12-14 DIAGNOSIS — E039 Hypothyroidism, unspecified: Secondary | ICD-10-CM | POA: Insufficient documentation

## 2017-12-14 DIAGNOSIS — Z79899 Other long term (current) drug therapy: Secondary | ICD-10-CM | POA: Insufficient documentation

## 2017-12-14 DIAGNOSIS — F17210 Nicotine dependence, cigarettes, uncomplicated: Secondary | ICD-10-CM | POA: Insufficient documentation

## 2017-12-14 LAB — CBC WITH DIFFERENTIAL/PLATELET
Abs Immature Granulocytes: 0 10*3/uL (ref 0.0–0.1)
Basophils Absolute: 0 10*3/uL (ref 0.0–0.1)
Basophils Relative: 0 %
EOS PCT: 3 %
Eosinophils Absolute: 0.4 10*3/uL (ref 0.0–0.7)
HEMATOCRIT: 37.7 % (ref 36.0–46.0)
Hemoglobin: 11.7 g/dL — ABNORMAL LOW (ref 12.0–15.0)
Immature Granulocytes: 0 %
LYMPHS PCT: 19 %
Lymphs Abs: 2.3 10*3/uL (ref 0.7–4.0)
MCH: 28.3 pg (ref 26.0–34.0)
MCHC: 31 g/dL (ref 30.0–36.0)
MCV: 91.3 fL (ref 78.0–100.0)
MONOS PCT: 4 %
Monocytes Absolute: 0.5 10*3/uL (ref 0.1–1.0)
Neutro Abs: 8.7 10*3/uL — ABNORMAL HIGH (ref 1.7–7.7)
Neutrophils Relative %: 74 %
Platelets: 422 10*3/uL — ABNORMAL HIGH (ref 150–400)
RBC: 4.13 MIL/uL (ref 3.87–5.11)
RDW: 13.9 % (ref 11.5–15.5)
WBC: 11.9 10*3/uL — AB (ref 4.0–10.5)

## 2017-12-14 LAB — BASIC METABOLIC PANEL
ANION GAP: 7 (ref 5–15)
BUN: 17 mg/dL (ref 6–20)
CO2: 26 mmol/L (ref 22–32)
CREATININE: 0.91 mg/dL (ref 0.44–1.00)
Calcium: 9 mg/dL (ref 8.9–10.3)
Chloride: 108 mmol/L (ref 98–111)
GFR calc non Af Amer: >60 mL/min (ref >60)
Glucose, Bld: 98 mg/dL (ref 70–99)
Potassium: 3.4 mmol/L — ABNORMAL LOW (ref 3.5–5.1)
Sodium: 141 mmol/L (ref 135–145)

## 2017-12-14 MED ORDER — DIPHENHYDRAMINE HCL 25 MG PO CAPS
25.0000 mg | ORAL_CAPSULE | Freq: Once | ORAL | Status: AC
  Administered 2017-12-14: 25 mg via ORAL
  Filled 2017-12-14: qty 1

## 2017-12-14 MED ORDER — CEPHALEXIN 500 MG PO CAPS: 500.0000 mg | ORAL_CAPSULE | Freq: Four times a day (QID) | ORAL | 0 refills | Status: AC

## 2017-12-14 MED ORDER — CEPHALEXIN 250 MG PO CAPS
500.0000 mg | ORAL_CAPSULE | Freq: Once | ORAL | Status: AC
  Administered 2017-12-14: 500 mg via ORAL
  Filled 2017-12-14: qty 2

## 2017-12-14 NOTE — ED Provider Notes (Signed)
Patient placed in Quick Look pathway, seen and evaluated   Chief Complaint: Right lower extremity redness  HPI: Patient presents with 2 days of right lower extremity redness and pain.  She thinks that she may have been bitten by an insect.  Area is tender to palpation.  She is able to ambulate, bear weight, and move her ankle without significant problems.  No fevers, nausea, vomiting, or diarrhea.  No history of diabetes or immunocompromise.  ROS:  Positive ROS: (+) Skin color change Negative ROS: (-) Fever  Physical Exam:   Gen: No distress  Neuro: Awake and Alert  Skin: Warm    Focused Exam: Heart RRR, nml S1,S2, no m/r/g; Lungs CTAB; Abd soft, NT, no rebound or guarding; Ext 2+ pedal pulses bilaterally, mild edema and tenderness of the right ankle and foot with approximately 10 cm area of erythema, warmth to the lateral right ankle.   BP (!) 154/91 (BP Location: Right Arm)   Pulse 90   Temp 98.7 F (37.1 C) (Oral)   Resp 18   Ht 5\' 6"  (1.676 m)   Wt 95.3 kg (210 lb)   LMP 12/10/2017 (Exact Date)   SpO2 97%   BMI 33.89 kg/m   Plan: CBC, BMP.  Initiation of care has begun. The patient has been counseled on the process, plan, and necessity for staying for the completion/evaluation, and the remainder of the medical screening examination    Renne CriglerGeiple, Polina Burmaster, Cordelia Poche-C 12/14/17 Herbie [id number]    Cathren LaineSteinl, Kevin, MD 12/14/17 2226

## 2017-12-14 NOTE — ED Triage Notes (Signed)
Pt reports that she believes she was bit by a spider 2 days ago. Reports that she noticed redness and itching but is unsure what bit her but states that she was previously bit by a spider and thinks it is similar. Pt has redness and swelling to her right lower leg.

## 2017-12-14 NOTE — ED Provider Notes (Signed)
[facility] Provider Note   CSN: [id number] Arrival date & time: 12/14/17  1753  History   Chief Complaint Chief Complaint  Patient presents with  . Insect Bite   HPI  Patient is a 47 year old female with history of thyroid dysfunction presenting to the ED for concern for insect bite.  She states she had onset of itching and skin redness to the right lower leg 2 days ago and suspects she was bitten by an insect, although she did not see one.  Associated with swelling.  She has not been taking any medications at home for the itching.  It is tender but not otherwise painful.  She is able to walk on it.  She denies snakebite.  No numbness or weakness.  No history of similar symptoms in the past.  No other recent illness or injury.  Past Medical History:  Diagnosis Date  . GERD (gastroesophageal reflux disease)   . Hyperthyroidism   . Hypothyroidism   . Thyroid disease    hyper and hypo    Patient Active Problem List   Diagnosis Date Noted  . Fall 02/13/2013  . Unspecified high-risk pregnancy 08/13/2012    Past Surgical History:  Procedure Laterality Date  . CESAREAN SECTION    . CESAREAN SECTION N/A 08/22/2012   Procedure: CESAREAN SECTION;  Surgeon: Antionette CharLisa Jackson-Moore, MD;  Location: [facility];  Service: Obstetrics;  Laterality: N/A;  . DILATION AND CURETTAGE OF UTERUS    . TUBAL LIGATION Bilateral 08/22/2012   Procedure: BILATERAL TUBAL LIGATION;  Surgeon: Antionette CharLisa Jackson-Moore, MD;  Location: [facility];  Service: Obstetrics;  Laterality: Bilateral;     OB History    Gravida  8   Para  4   Term  4   Preterm      AB  4   Living  2     SAB  1   TAB  2   Ectopic  1   Multiple  0   Live Births  2            Home Medications    Prior to Admission medications   Medication Sig Start Date End Date Taking? Authorizing Provider  cephALEXin (KEFLEX) 500 MG capsule Take 1 capsule (500 mg total) by mouth 4 (four) times daily for 7  days. 12/14/17 12/21/17  Cecille PoMacklin, Takita Riecke W, MD  lidocaine (LIDODERM) 5 % Place 1 patch onto the skin daily. Remove & Discard patch within 12 hours or as directed by MD 06/10/17   Joy, Shawn C, PA-C  methocarbamol (ROBAXIN) 500 MG tablet Take 1 tablet (500 mg total) by mouth 2 (two) times daily. 06/10/17   Joy, Shawn C, PA-C  naproxen (NAPROSYN) 500 MG tablet Take 1 tablet (500 mg total) by mouth 2 (two) times daily. 06/10/17   Joy, Shawn C, PA-C  traMADol (ULTRAM) 50 MG tablet Take 1 tablet (50 mg total) by mouth every 8 (eight) hours. 04/08/13   Lenda KelpHudnall, Shane R, MD    Family History Family History  Problem Relation Age of Onset  . Hypertension Mother   . Diabetes Father   . Cancer Father   . Hyperlipidemia Neg Hx   . Heart attack Neg Hx   . Sudden death Neg Hx     Social History Social History   Tobacco Use  . Smoking status: Current Every Day Smoker  . Smokeless tobacco: Never Used  Substance Use Topics  . Alcohol use: No  . Drug use: No  Allergies   Condoms - female [nonoxynol 9]   Review of Systems Review of Systems  Constitutional: Negative for fever.  HENT: Negative for congestion and sore throat.   Respiratory: Negative for shortness of breath.   Cardiovascular: Negative for chest pain.  Gastrointestinal: Negative for abdominal pain, diarrhea and vomiting.  Musculoskeletal: Negative for neck pain.  All other systems reviewed and are negative.    Physical Exam Updated Vital Signs BP (!) 150/92 (BP Location: Right Arm)   Pulse 73   Temp 98.7 F (37.1 C) (Oral)   Resp 16   Ht 5\' 6"  (1.676 m)   Wt 95.3 kg (210 lb)   LMP 12/10/2017 (Exact Date)   SpO2 100%   BMI 33.89 kg/m   Physical Exam  Constitutional: She is oriented to person, place, and time. No distress.  HENT:  Head: Normocephalic and atraumatic.  Mouth/Throat: Oropharynx is clear and moist.  Eyes: Pupils are equal, round, and reactive to light. Conjunctivae are normal.  Neck: Neck supple. No  tracheal deviation present.  Cardiovascular: Normal rate, regular rhythm, normal heart sounds and intact distal pulses.  No murmur heard. Pulmonary/Chest: Effort normal and breath sounds normal. No stridor. No respiratory distress. She has no wheezes. She has no rales.  Abdominal: Soft. She exhibits no distension and no mass. There is no tenderness. There is no guarding.  Musculoskeletal: She exhibits no deformity.  Over the right lower lateral leg, there is an area of approximately 10 cm with irregular well-demarcated borders which has darkened and erythematous.  No increased warmth.  There is 1+ pitting ankle edema distally.  No fluctuance or induration.  Distal pulses and sensation intact.  Neurological: She is alert and oriented to person, place, and time.  Skin: Skin is warm and dry.  Psychiatric: She has a normal mood and affect. Her behavior is normal.  Nursing note and vitals reviewed.    ED Treatments / Results  Labs (all labs ordered are listed, but only abnormal results are displayed) Labs Reviewed  CBC WITH DIFFERENTIAL/PLATELET - Abnormal; Notable for the following components:      Result Value   WBC 11.9 (*)    Hemoglobin 11.7 (*)    Platelets 422 (*)    Neutro Abs 8.7 (*)    All other components within normal limits  BASIC METABOLIC PANEL - Abnormal; Notable for the following components:   Potassium 3.4 (*)    All other components within normal limits    EKG None  Radiology No results found.  Procedures Procedures (including critical care time)  Medications Ordered in ED Medications  diphenhydrAMINE (BENADRYL) capsule 25 mg (25 mg Oral Given 12/14/17 2151)  cephALEXin (KEFLEX) capsule 500 mg (500 mg Oral Given 12/14/17 2221)     Initial Impression / Assessment and Plan / ED Course  I have reviewed the triage vital signs and the nursing notes.  Pertinent labs & imaging results that were available during my care of the patient were reviewed by me and  considered in my medical decision making (see chart for details).  Is a healthy 47 year old female presenting to the ED for concern for insect bite as above.  Clinical picture most consistent with cellulitis.  No signs of abscess.  Doubt DVT.  No signs of systemic infection.  Patient given Benadryl with improvement in pruritus.  Will discharge home on course of Keflex.  Patient informed of all ED findings. Return precautions and follow up plan reviewed. All questions answered.   Final  Clinical Impressions(s) / ED Diagnoses   Final diagnoses:  Cellulitis of right lower extremity    ED Discharge Orders        Ordered    cephALEXin (KEFLEX) 500 MG capsule  4 times daily     12/14/17 2212       Cecille Po, MD 12/14/17 2223    Cathren Laine, MD 12/14/17 2226

## 2017-12-14 NOTE — ED Notes (Signed)
Patient Alert and oriented to baseline. Stable and ambulatory to baseline. Patient verbalized understanding of the discharge instructions.  Patient belongings were taken by the patient.   

## 2019-07-01 ENCOUNTER — Encounter (HOSPITAL_COMMUNITY): Payer: Self-pay

## 2019-07-01 ENCOUNTER — Ambulatory Visit (HOSPITAL_COMMUNITY)
Admission: EM | Admit: 2019-07-01 | Discharge: 2019-07-01 | Disposition: A | Discharge status: Home / Self Care | Payer: Self-pay | Attending: Emergency Medicine | Admitting: Emergency Medicine

## 2019-07-01 ENCOUNTER — Other Ambulatory Visit: Payer: Self-pay

## 2019-07-01 DIAGNOSIS — R42 Dizziness and giddiness: Secondary | ICD-10-CM | POA: Insufficient documentation

## 2019-07-01 DIAGNOSIS — I10 Essential (primary) hypertension: Secondary | ICD-10-CM | POA: Insufficient documentation

## 2019-07-01 LAB — CBC
HCT: 39.4 % (ref 36.0–46.0)
Hemoglobin: 12.4 g/dL (ref 12.0–15.0)
MCH: 27.4 pg (ref 26.0–34.0)
MCHC: 31.5 g/dL (ref 30.0–36.0)
MCV: 87 fL (ref 80.0–100.0)
Platelets: 445 10*3/uL — ABNORMAL HIGH (ref 150–400)
RBC: 4.53 MIL/uL (ref 3.87–5.11)
RDW: 14.7 % (ref 11.5–15.5)
WBC: 9.9 10*3/uL (ref 4.0–10.5)
nRBC: 0 % (ref 0.0–0.2)

## 2019-07-01 LAB — BASIC METABOLIC PANEL
Anion gap: 9 (ref 5–15)
BUN: 13 mg/dL (ref 6–20)
CO2: 27 mmol/L (ref 22–32)
Calcium: 8.6 mg/dL — ABNORMAL LOW (ref 8.9–10.3)
Chloride: 101 mmol/L (ref 98–111)
Creatinine, Ser: 0.84 mg/dL (ref 0.44–1.00)
GFR calc Af Amer: >60 mL/min (ref >60)
GFR calc non Af Amer: >60 mL/min (ref >60)
Glucose, Bld: 88 mg/dL (ref 70–99)
Potassium: 3.7 mmol/L (ref 3.5–5.1)
Sodium: 137 mmol/L (ref 135–145)

## 2019-07-01 LAB — TSH: TSH: 4.031 u[IU]/mL (ref 0.350–4.500)

## 2019-07-01 MED ORDER — AMLODIPINE BESYLATE 5 MG PO TABS: 5.0000 mg | ORAL_TABLET | Freq: Every day | ORAL | 0 refills | Status: DC

## 2019-07-01 NOTE — ED Provider Notes (Signed)
[place]    CSN: [id number] Arrival date & time: 07/01/19  1636      History   Chief Complaint Chief Complaint  Patient presents with  . Dizziness    HPI ENA DEMARY is a 49 y.o. female history of perinatal thyroid abnormalities, GERD, presenting today for evaluation of dizziness.  Patient states that over the past 3 days she has had intermittent twinges within her left chest.  States that these are brief and eased off.  Today she has developed increased dizziness which she describes as lightheadedness.  Denies sensation of presyncope.  Denies any double vision and denies any vision changes today.  Has noticed some more frequent headaches than normal that are located in bitemporal areas and are often throbbing in nature.  Denies current headache.  Denies current chest pain.  Denies associated shortness of breath with symptoms.  Denies associated nausea or vomiting.  Denies fevers.  Denies history of prior hypertension and needing medicine.  Denies history of diabetes.  Denies prior DVT/PE.  Denies leg pain or leg swelling.  Denies estrogen use.  Denies recent travel immobilization.  Patient does smoke a few cigarettes per day.     Past Medical History:  Diagnosis Date  . GERD (gastroesophageal reflux disease)   . Hyperthyroidism   . Hypothyroidism   . Thyroid disease    hyper and hypo    Patient Active Problem List   Diagnosis Date Noted  . Fall 02/13/2013  . Unspecified high-risk pregnancy 08/13/2012    Past Surgical History:  Procedure Laterality Date  . CESAREAN SECTION    . CESAREAN SECTION N/A 08/22/2012   Procedure: CESAREAN SECTION;  Surgeon: Lahoma Crocker, MD;  Location: [facility];  Service: Obstetrics;  Laterality: N/A;  . DILATION AND CURETTAGE OF UTERUS    . TUBAL LIGATION Bilateral 08/22/2012   Procedure: BILATERAL TUBAL LIGATION;  Surgeon: Lahoma Crocker, MD;  Location: [facility];  Service: Obstetrics;  Laterality: Bilateral;    OB History     Gravida  8   Para  4   Term  4   Preterm      AB  4   Living  2     SAB  1   TAB  2   Ectopic  1   Multiple  0   Live Births  2            Home Medications    Prior to Admission medications   Medication Sig Start Date End Date Taking? Authorizing Provider  amLODipine (NORVASC) 5 MG tablet Take 1 tablet (5 mg total) by mouth daily. 07/01/19   Antoine Vandermeulen C, PA-C  lidocaine (LIDODERM) 5 % Place 1 patch onto the skin daily. Remove & Discard patch within 12 hours or as directed by MD 06/10/17   Joy, Shawn C, PA-C  methocarbamol (ROBAXIN) 500 MG tablet Take 1 tablet (500 mg total) by mouth 2 (two) times daily. 06/10/17   Joy, Shawn C, PA-C  naproxen (NAPROSYN) 500 MG tablet Take 1 tablet (500 mg total) by mouth 2 (two) times daily. 06/10/17   Joy, Shawn C, PA-C  traMADol (ULTRAM) 50 MG tablet Take 1 tablet (50 mg total) by mouth every 8 (eight) hours. 04/08/13   Dene Gentry, MD    Family History Family History  Problem Relation Age of Onset  . Hypertension Mother   . Diabetes Father   . Cancer Father   . Hyperlipidemia Neg Hx   . Heart  attack Neg Hx   . Sudden death Neg Hx     Social History Social History   Tobacco Use  . Smoking status: Current Every Day Smoker  . Smokeless tobacco: Never Used  Substance Use Topics  . Alcohol use: No  . Drug use: No     Allergies   Condoms - female [nonoxynol 9]   Review of Systems Review of Systems  Constitutional: Negative for fatigue and fever.  HENT: Negative for congestion, sinus pressure and sore throat.   Eyes: Negative for photophobia, pain and visual disturbance.  Respiratory: Negative for cough.   Cardiovascular: Negative for leg swelling.  Gastrointestinal: Negative for abdominal pain.  Genitourinary: Negative for decreased urine volume and hematuria.  Musculoskeletal: Negative for myalgias, neck pain and neck stiffness.  Neurological: Positive for light-headedness. Negative for syncope, facial  asymmetry, speech difficulty and numbness.     Physical Exam Triage Vital Signs ED Triage Vitals  Enc Vitals Group     BP 07/01/19 1742 (!) 195/99     Pulse Rate 07/01/19 1742 87     Resp 07/01/19 1742 16     Temp 07/01/19 1742 98.6 F (37 C)     Temp Source 07/01/19 1742 Oral     SpO2 07/01/19 1742 100 %     Weight 07/01/19 1746 213 lb (96.6 kg)     Height --      Head Circumference --      Peak Flow --      Pain Score 07/01/19 1746 5     Pain Loc --      Pain Edu? --      Excl. in GC? --    No data found.  Updated Vital Signs BP (!) 195/99 (BP Location: Right Arm)   Pulse 87   Temp 98.6 F (37 C) (Oral)   Resp 16   Wt 213 lb (96.6 kg)   LMP 06/01/2019   SpO2 100%   BMI 34.38 kg/m    Orthostatic VS for the past 24 hrs:  BP- Lying Pulse- Lying BP- Sitting Pulse- Sitting BP- Standing at 0 minutes Pulse- Standing at 0 minutes  07/01/19 1909 (!) 180/97 72 (!) 199/101 72 (!) 174/118 85      Visual Acuity Right Eye Distance:   Left Eye Distance:   Bilateral Distance:    Right Eye Near:   Left Eye Near:    Bilateral Near:     Physical Exam Vitals and nursing note reviewed.  Constitutional:      General: She is not in acute distress.    Appearance: She is well-developed.  HENT:     Head: Normocephalic and atraumatic.     Ears:     Comments: Bilateral ears without tenderness to palpation of external auricle, tragus and mastoid, EAC's without erythema or swelling, TM's with good bony landmarks and cone of light. Non erythematous.     Mouth/Throat:     Comments: Oral mucosa pink and moist, no tonsillar enlargement or exudate. Posterior pharynx patent and nonerythematous, no uvula deviation or swelling. Normal phonation. Palate elevates symmetrically Eyes:     Extraocular Movements: Extraocular movements intact.     Conjunctiva/sclera: Conjunctivae normal.     Pupils: Pupils are equal, round, and reactive to light.  Cardiovascular:     Rate and Rhythm:  Normal rate and regular rhythm.     Heart sounds: No murmur.  Pulmonary:     Effort: Pulmonary effort is normal. No respiratory distress.  Breath sounds: Normal breath sounds.     Comments: Breathing comfortably at rest, CTABL, no wheezing, rales or other adventitious sounds auscultated Abdominal:     Palpations: Abdomen is soft.     Tenderness: There is no abdominal tenderness.  Musculoskeletal:     Cervical back: Neck supple.  Skin:    General: Skin is warm and dry.  Neurological:     General: No focal deficit present.     Mental Status: She is alert and oriented to person, place, and time. Mental status is at baseline.     Comments: Patient A&O x3, cranial nerves II-XII grossly intact, strength at shoulders, hips and knees 5/5, equal bilaterally. Gait without abnormality.      UC Treatments / Results  Labs (all labs ordered are listed, but only abnormal results are displayed) Labs Reviewed  CBC  BASIC METABOLIC PANEL  TSH    EKG   Radiology No results found.  Procedures Procedures (including critical care time)  Medications Ordered in UC Medications - No data to display  Initial Impression / Assessment and Plan / UC Course  I have reviewed the triage vital signs and the nursing notes.  Pertinent labs & imaging results that were available during my care of the patient were reviewed by me and considered in my medical decision making (see chart for details).  Clinical Course as of Jun 30 1899  Tue Jul 01, 2019  1821 193/108 BP rechecked   [HW]    Clinical Course User Index [HW] Lew Dawes, [place]    EKG sinus rhythm with first-degree AV block, present on prior EKG from 2013.  No acute signs of ischemia or infarction.  Stable from prior EKGs.  Currently without chest pain or other red flag symptoms.  Significantly elevated blood pressure today.  Checking basic labs of CBC, BMP and TSH.  Initiating on amlodipine 5 mg daily, stressed importance of  establishing care with primary care and having follow-up in 1 to 2 weeks for blood pressure recheck and further management of blood pressure.  Given associated brief episodes of chest pain recommended to follow-up with cardiology outpatient.  EKG and presentation less suspicious of ACS at this time.  Advised patient if symptoms returning, developing chest pain, shortness of breath, nausea, vomiting, worsening dizziness, headache or vision change to follow-up in the emergency room immediately.  Discussed strict return precautions. Patient verbalized understanding and is agreeable with plan.  Final Clinical Impressions(s) / UC Diagnoses   Final diagnoses:  Dizziness  Essential hypertension     Discharge Instructions     Your blood pressure was elevated today in clinic.  Begin amlodipine daily.  Please call tomorrow morning to establish care with primary care for follow-up in 1 to 2 weeks.  Blood work pending Drink plenty of fluids Read attached on DASH diet  Please go to Emergency Room if you start to experience severe headache, vision changes, decreased urine production, chest pain, shortness of breath, speech slurring, one sided weakness.   ED Prescriptions    Medication Sig Dispense Auth. Provider   amLODipine (NORVASC) 5 MG tablet  (Status: Discontinued) Take 1 tablet (5 mg total) by mouth daily. 30 tablet Ralph Benavidez C, PA-C   amLODipine (NORVASC) 5 MG tablet Take 1 tablet (5 mg total) by mouth daily. 30 tablet Jiyaan Steinhauser, [place] C, PA-C     PDMP not reviewed this encounter.   Lew Dawes, [place] 07/01/19 1945

## 2019-07-01 NOTE — ED Triage Notes (Signed)
Pt states she has been dizzy 3 days and She's stressed out . Pt states she's having a lot of life change events going on right now.

## 2019-07-01 NOTE — Discharge Instructions (Signed)
Your blood pressure was elevated today in clinic.  Begin amlodipine daily.  Please call tomorrow morning to establish care with primary care for follow-up in 1 to 2 weeks.  Blood work pending Drink plenty of fluids Read attached on DASH diet  Please go to Emergency Room if you start to experience severe headache, vision changes, decreased urine production, chest pain, shortness of breath, speech slurring, one sided weakness.

## 2021-11-04 ENCOUNTER — Encounter (HOSPITAL_COMMUNITY): Payer: Self-pay | Admitting: Emergency Medicine

## 2021-11-04 ENCOUNTER — Emergency Department (HOSPITAL_COMMUNITY)
Admission: EM | Admit: 2021-11-04 | Discharge: 2021-11-05 | Disposition: A | Discharge status: Home / Self Care | Payer: 59 | Attending: Emergency Medicine | Admitting: Emergency Medicine

## 2021-11-04 DIAGNOSIS — I10 Essential (primary) hypertension: Secondary | ICD-10-CM | POA: Diagnosis not present

## 2021-11-04 DIAGNOSIS — R519 Headache, unspecified: Secondary | ICD-10-CM | POA: Diagnosis present

## 2021-11-04 DIAGNOSIS — E039 Hypothyroidism, unspecified: Secondary | ICD-10-CM | POA: Diagnosis not present

## 2021-11-04 HISTORY — DX: Essential (primary) hypertension: I10

## 2021-11-04 LAB — TROPONIN I (HIGH SENSITIVITY)
Troponin I (High Sensitivity): 7 ng/L (ref <18)
Troponin I (High Sensitivity): 7 ng/L (ref <18)

## 2021-11-04 LAB — BASIC METABOLIC PANEL
Anion gap: 11 (ref 5–15)
BUN: 18 mg/dL (ref 6–20)
CO2: 26 mmol/L (ref 22–32)
Calcium: 9.6 mg/dL (ref 8.9–10.3)
Chloride: 105 mmol/L (ref 98–111)
Creatinine, Ser: 0.93 mg/dL (ref 0.44–1.00)
GFR, Estimated: >60 mL/min (ref >60)
Glucose, Bld: 84 mg/dL (ref 70–99)
Potassium: 3.7 mmol/L (ref 3.5–5.1)
Sodium: 142 mmol/L (ref 135–145)

## 2021-11-04 LAB — CBC
HCT: 40.3 % (ref 36.0–46.0)
Hemoglobin: 13 g/dL (ref 12.0–15.0)
MCH: 29.7 pg (ref 26.0–34.0)
MCHC: 32.3 g/dL (ref 30.0–36.0)
MCV: 92 fL (ref 80.0–100.0)
Platelets: 388 10*3/uL (ref 150–400)
RBC: 4.38 MIL/uL (ref 3.87–5.11)
RDW: 13.5 % (ref 11.5–15.5)
WBC: 7.8 10*3/uL (ref 4.0–10.5)
nRBC: 0 % (ref 0.0–0.2)

## 2021-11-04 NOTE — ED Triage Notes (Signed)
Patient BIB [facility] from PCP for evaluation of hypertension. Patient was on "a medication that made her cough so [she] stopped taking it" and then nifedipine, which she also stopped taking. Patient complains of a mild headache. Patient is alert, oriented, ambulatory, and in no apparent distress at this time.

## 2021-11-04 NOTE — ED Provider Triage Note (Signed)
Emergency Medicine Provider Triage Evaluation Note  KIARI HOSMER , a 51 y.o. female  was evaluated in triage.  Pt complains of elevated blood pressure onset today.  Patient has not been compliant with her antihypertensives.  Denies chest pain, shortness of breath, urinary symptoms, headache, vision changes.  Review of Systems  Positive: As per HPI above Negative:   Physical Exam  BP (!) 182/112 (BP Location: Left Arm)   Pulse 84   Temp (!) 97.1 F (36.2 C) (Oral)   Resp 16   SpO2 100%  Gen:   Awake, no distress   Resp:  Normal effort  MSK:   Moves extremities without difficulty  Other:  No chest wall tenderness to palpation  Medical Decision Making  Medically screening exam initiated at 5:05 PM.  Appropriate orders placed.  Evaristo Bury was informed that the remainder of the evaluation will be completed by another provider, this initial triage assessment does not replace that evaluation, and the importance of remaining in the ED until their evaluation is complete.  Work-up initiated   Shamiah Kahler A, PA-C 11/04/21 1718

## 2021-11-05 MED ORDER — NIFEDIPINE ER OSMOTIC RELEASE 30 MG PO TB24: 30.0000 mg | ORAL_TABLET | Freq: Every day | ORAL | 0 refills | Status: AC

## 2021-11-05 NOTE — ED Notes (Signed)
Pt c/o headache. Provider made aware.

## 2021-11-05 NOTE — ED Notes (Signed)
ED provider at bedside.

## 2021-11-05 NOTE — ED Provider Notes (Signed)
Emergency Department Provider Note   I have reviewed the triage vital signs and the nursing notes.   HISTORY  Chief Complaint Hypertension   HPI Danielle [name] is a 51 y.o. female presents to the ED with elevated BP and mild HA earlier today. Notes some fatigue feeling as well. No fever. She is intermittently compliant with BP medications since January. Lisinopril caused coughing and she has tolerated Nifedipine well but tells me that she stopped taking after trying diet/exercise and not wanting to be on chronic medication. No CP or SOB. Notes some palpitations earlier. Was seen by her PCP and sent to the ED for evaluation.   Past Medical History:  Diagnosis Date   GERD (gastroesophageal reflux disease)    Hypertension    Hyperthyroidism    Hypothyroidism    Thyroid disease    hyper and hypo    Review of Systems  Constitutional: No fever/chills Eyes: No visual changes. ENT: No sore throat. Cardiovascular: Denies chest pain. Positive palpitations and elevated BP.  Respiratory: Denies shortness of breath. Gastrointestinal: No abdominal pain.  No nausea, no vomiting.  No diarrhea.  No constipation. Genitourinary: Negative for dysuria. Musculoskeletal: Negative for back pain. Skin: Negative for rash. Neurological: Negative for headaches, focal weakness or numbness.  ____________________________________________   PHYSICAL EXAM:  VITAL SIGNS: ED Triage Vitals  Enc Vitals Group     BP 11/04/21 1702 (!) 182/112     Pulse Rate 11/04/21 1702 84     Resp 11/04/21 1702 16     Temp 11/04/21 1702 (!) 97.1 F (36.2 C)     Temp Source 11/04/21 1702 Oral     SpO2 11/04/21 1702 100 %   Constitutional: Alert and oriented. Well appearing and in no acute distress. Eyes: Conjunctivae are normal. Head: Atraumatic. Nose: No congestion/rhinnorhea. Mouth/Throat: Mucous membranes are moist.   Neck: No stridor.   Cardiovascular: Normal rate, regular rhythm. Good peripheral  circulation. Grossly normal heart sounds.   Respiratory: Normal respiratory effort.  No retractions. Lungs CTAB. Gastrointestinal: No distention.  Musculoskeletal: No gross deformities of extremities. Neurologic:  Normal speech and language. No gross focal neurologic deficits are appreciated.  Skin:  Skin is warm, dry and intact. No rash noted.  ____________________________________________   LABS (all labs ordered are listed, but only abnormal results are displayed)  Labs Reviewed  BASIC METABOLIC PANEL  CBC  TROPONIN I (HIGH SENSITIVITY)  TROPONIN I (HIGH SENSITIVITY)   ____________________________________________  EKG   EKG Interpretation  Date/Time:  Friday November 04 2021 17:28:45 EDT Ventricular Rate:  82 PR Interval:  212 QRS Duration: 82 QT Interval:  398 QTC Calculation: 464 R Axis:   84 Text Interpretation: Sinus rhythm with 1st degree A-V block Otherwise normal ECG When compared with ECG of 01-Jul-2019 18:02, PREVIOUS ECG IS PRESENT Confirmed by Tilden Fossa [phone]) on 11/05/2021 2:15:57 PM        ____________________________________________   PROCEDURES  Procedure(s) performed:   Procedures  None ____________________________________________   INITIAL IMPRESSION / ASSESSMENT AND PLAN / ED COURSE  Pertinent labs & imaging results that were available during my care of the patient were reviewed by me and considered in my medical decision making (see chart for details).   This patient is Presenting for Evaluation of palpitations/HTN, which does require a range of treatment options, and is a complaint that involves a high risk of morbidity and mortality.  The Differential Diagnoses include arrhythmia, ACS, PE, HTN emergency.    I did obtain Additional  Historical Information from daughter at bedside.  I decided to review pertinent External Data, and in summary patient with paper AVS from PCP which was reviewed including home BP meds/dosing although  patient is not taking them.   Clinical Laboratory Tests Ordered, included BMP w/o AKI. No electrolyte disturbance. No anemia. Troponin negative.    Social Determinants of Health Risk patient is a smoker.   Medical Decision Making: Summary:  Patient presents to the ED with HTN and palpitations earlier although not currently. EKG reassuring along with normal labs. No evidence of HTN emergency. Had Danielle [name] discussion regarding HTN and med mgmt. She agrees with plan to re-start her Nifedipine for BP mgmt which was working well for her with few side effects when she was taking this. She has PCP follow up and meeting with dietician in the coming weeks. Once compliant with meds, adjustment can be made by PCP as needed.   Reevaluation with update and discussion with patient regarding labs and follow up plan.   Disposition: discharge  ____________________________________________  FINAL CLINICAL IMPRESSION(S) / ED DIAGNOSES  Final diagnoses:  Primary hypertension     NEW OUTPATIENT MEDICATIONS STARTED DURING THIS VISIT:  Discharge Medication List as of 11/05/2021 12:48 AM     START taking these medications   Details  NIFEdipine (PROCARDIA-XL/NIFEDICAL-XL) 30 MG 24 hr tablet Take 1 tablet (30 mg total) by mouth daily., Starting Sat 11/05/2021, Normal        Note:  This document was prepared using Dragon voice recognition software and may include unintentional dictation errors.  Alona Bene, MD, [facility] Emergency Medicine    [name], [name], MD 11/05/21 [phone]

## 2021-11-05 NOTE — Discharge Instructions (Signed)
Please read through the information provided and take your home blood pressure medications. Return with any new or worsening symptoms.

## 2022-03-31 ENCOUNTER — Encounter (HOSPITAL_COMMUNITY): Payer: Self-pay

## 2022-03-31 ENCOUNTER — Ambulatory Visit (HOSPITAL_COMMUNITY)
Admission: EM | Admit: 2022-03-31 | Discharge: 2022-03-31 | Disposition: A | Discharge status: Home / Self Care | Payer: Commercial Managed Care - HMO | Attending: Family Medicine | Admitting: Family Medicine

## 2022-03-31 ENCOUNTER — Ambulatory Visit (INDEPENDENT_AMBULATORY_CARE_PROVIDER_SITE_OTHER): Payer: Commercial Managed Care - HMO

## 2022-03-31 DIAGNOSIS — M79602 Pain in left arm: Secondary | ICD-10-CM | POA: Diagnosis not present

## 2022-03-31 DIAGNOSIS — M79642 Pain in left hand: Secondary | ICD-10-CM | POA: Diagnosis not present

## 2022-03-31 DIAGNOSIS — M79622 Pain in left upper arm: Secondary | ICD-10-CM

## 2022-03-31 MED ORDER — KETOROLAC TROMETHAMINE 30 MG/ML IJ SOLN
INTRAMUSCULAR | Status: AC
  Filled 2022-03-31: qty 1

## 2022-03-31 MED ORDER — IBUPROFEN 800 MG PO TABS: 800.0000 mg | ORAL_TABLET | Freq: Three times a day (TID) | ORAL | 0 refills | Status: DC | PRN

## 2022-03-31 MED ORDER — KETOROLAC TROMETHAMINE 30 MG/ML IJ SOLN
30.0000 mg | Freq: Once | INTRAMUSCULAR | Status: AC
  Administered 2022-03-31: 30 mg via INTRAMUSCULAR
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
## 2022-03-31 NOTE — Discharge Instructions (Addendum)
Your x-rays did not show any fractures   Take ibuprofen 800 mg--1 tab every 8 hours as needed for pain.

## 2022-04-18 ENCOUNTER — Encounter (HOSPITAL_COMMUNITY): Payer: Self-pay

## 2022-04-18 ENCOUNTER — Ambulatory Visit (HOSPITAL_COMMUNITY)
Admission: EM | Admit: 2022-04-18 | Discharge: 2022-04-18 | Disposition: A | Discharge status: Home / Self Care | Payer: Commercial Managed Care - HMO | Attending: Family Medicine | Admitting: Family Medicine

## 2022-04-18 DIAGNOSIS — M542 Cervicalgia: Secondary | ICD-10-CM

## 2022-04-18 DIAGNOSIS — M79645 Pain in left finger(s): Secondary | ICD-10-CM | POA: Diagnosis not present

## 2022-04-18 MED ORDER — DEXAMETHASONE SODIUM PHOSPHATE 10 MG/ML IJ SOLN
INTRAMUSCULAR | Status: AC
  Filled 2022-04-18: qty 1

## 2022-04-18 MED ORDER — DEXAMETHASONE SODIUM PHOSPHATE 10 MG/ML IJ SOLN
10.0000 mg | Freq: Once | INTRAMUSCULAR | Status: AC
  Administered 2022-04-18: 10 mg via INTRAMUSCULAR
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
## 2022-04-28 ENCOUNTER — Other Ambulatory Visit: Payer: Self-pay | Admitting: Family Medicine

## 2022-04-28 DIAGNOSIS — I10 Essential (primary) hypertension: Secondary | ICD-10-CM

## 2022-04-28 DIAGNOSIS — R252 Cramp and spasm: Secondary | ICD-10-CM

## 2022-05-09 ENCOUNTER — Ambulatory Visit
Admission: RE | Admit: 2022-05-09 | Discharge: 2022-05-09 | Disposition: A | Discharge status: Home / Self Care | Payer: Commercial Managed Care - HMO | Source: Ambulatory Visit | Attending: Family Medicine | Admitting: Family Medicine

## 2022-05-09 DIAGNOSIS — I10 Essential (primary) hypertension: Secondary | ICD-10-CM

## 2022-05-09 DIAGNOSIS — R252 Cramp and spasm: Secondary | ICD-10-CM

## 2022-05-31 ENCOUNTER — Ambulatory Visit (INDEPENDENT_AMBULATORY_CARE_PROVIDER_SITE_OTHER): Payer: Commercial Managed Care - HMO | Admitting: Sports Medicine

## 2022-05-31 VITALS — BP 126/80 | Ht 66.0 in | Wt 193.6 lb

## 2022-05-31 DIAGNOSIS — M25522 Pain in left elbow: Secondary | ICD-10-CM

## 2022-05-31 DIAGNOSIS — S46002A Unspecified injury of muscle(s) and tendon(s) of the rotator cuff of left shoulder, initial encounter: Secondary | ICD-10-CM

## 2022-05-31 MED ORDER — MELOXICAM 15 MG PO TABS: ORAL_TABLET | ORAL | 2 refills | Status: DC
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
## 2022-06-05 ENCOUNTER — Other Ambulatory Visit: Payer: Self-pay

## 2022-06-05 MED ORDER — MELOXICAM 15 MG PO TABS: ORAL_TABLET | ORAL | 2 refills | Status: DC

## 2022-06-05 NOTE — Progress Notes (Signed)
Pt called requesting Rx be sent to a different pharmacy.

## 2022-06-13 ENCOUNTER — Ambulatory Visit: Payer: Commercial Managed Care - HMO
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
## 2022-06-26 ENCOUNTER — Ambulatory Visit: Payer: Commercial Managed Care - HMO | Attending: Family Medicine | Admitting: Physical Therapy

## 2022-06-26 ENCOUNTER — Encounter: Payer: Self-pay | Admitting: Physical Therapy

## 2022-06-26 ENCOUNTER — Other Ambulatory Visit: Payer: Self-pay

## 2022-06-26 DIAGNOSIS — M542 Cervicalgia: Secondary | ICD-10-CM | POA: Diagnosis present

## 2022-06-26 DIAGNOSIS — S46002A Unspecified injury of muscle(s) and tendon(s) of the rotator cuff of left shoulder, initial encounter: Secondary | ICD-10-CM | POA: Insufficient documentation

## 2022-06-26 DIAGNOSIS — M6281 Muscle weakness (generalized): Secondary | ICD-10-CM | POA: Diagnosis present

## 2022-06-26 DIAGNOSIS — M25512 Pain in left shoulder: Secondary | ICD-10-CM | POA: Diagnosis present

## 2022-06-26 DIAGNOSIS — M25522 Pain in left elbow: Secondary | ICD-10-CM | POA: Diagnosis not present

## 2022-07-14 ENCOUNTER — Ambulatory Visit: Payer: Commercial Managed Care - HMO | Admitting: Physical Therapy

## 2022-07-24 ENCOUNTER — Telehealth: Payer: Self-pay

## 2022-07-24 NOTE — Telephone Encounter (Signed)
Received referral from Earnestine Leys at [facility] For OSA eval d/t snoring. Placed in sleep mailbox
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
## 2022-07-26 ENCOUNTER — Encounter: Payer: Self-pay | Admitting: Physical Therapy

## 2022-07-26 ENCOUNTER — Ambulatory Visit: Payer: Medicaid Other | Attending: Family Medicine | Admitting: Physical Therapy

## 2022-07-26 DIAGNOSIS — M6281 Muscle weakness (generalized): Secondary | ICD-10-CM | POA: Insufficient documentation

## 2022-07-26 DIAGNOSIS — M542 Cervicalgia: Secondary | ICD-10-CM | POA: Diagnosis present

## 2022-07-26 DIAGNOSIS — M25512 Pain in left shoulder: Secondary | ICD-10-CM | POA: Diagnosis present

## 2022-07-27 ENCOUNTER — Other Ambulatory Visit (HOSPITAL_COMMUNITY)
Admission: RE | Admit: 2022-07-27 | Discharge: 2022-07-27 | Disposition: A | Discharge status: Home / Self Care | Payer: Commercial Managed Care - HMO | Source: Ambulatory Visit | Attending: Obstetrics & Gynecology | Admitting: Obstetrics & Gynecology

## 2022-07-27 ENCOUNTER — Ambulatory Visit (INDEPENDENT_AMBULATORY_CARE_PROVIDER_SITE_OTHER): Payer: Commercial Managed Care - HMO | Admitting: Obstetrics & Gynecology

## 2022-07-27 ENCOUNTER — Encounter: Payer: Self-pay | Admitting: Obstetrics & Gynecology

## 2022-07-27 VITALS — BP 139/85 | HR 89 | Ht 66.0 in | Wt 191.0 lb

## 2022-07-27 DIAGNOSIS — L989 Disorder of the skin and subcutaneous tissue, unspecified: Secondary | ICD-10-CM

## 2022-07-27 DIAGNOSIS — Z01419 Encounter for gynecological examination (general) (routine) without abnormal findings: Secondary | ICD-10-CM | POA: Diagnosis present

## 2022-07-27 DIAGNOSIS — Z1231 Encounter for screening mammogram for malignant neoplasm of breast: Secondary | ICD-10-CM

## 2022-07-27 DIAGNOSIS — Z1211 Encounter for screening for malignant neoplasm of colon: Secondary | ICD-10-CM

## 2022-07-27 DIAGNOSIS — N951 Menopausal and female climacteric states: Secondary | ICD-10-CM

## 2022-07-27 MED ORDER — CLOBETASOL PROPIONATE 0.05 % EX OINT: TOPICAL_OINTMENT | CUTANEOUS | 5 refills | Status: AC

## 2022-07-27 MED ORDER — GABAPENTIN 600 MG PO TABS: 600.0000 mg | ORAL_TABLET | Freq: Every day | ORAL | 3 refills | Status: DC

## 2022-07-27 NOTE — Progress Notes (Signed)
GYNECOLOGY ANNUAL PREVENTATIVE CARE ENCOUNTER NOTE  History:     Danielle [name] is a 52 y.o. [phone] female here for a routine annual gynecologic exam.  Current complaints: rash in vulvar region; perimenopausal symptoms.   Pt reports a rash around the "lips" of her vagina, in the vulvar region. Reports this feels like skin tags to her. Intermittently irritating. She reports a history of eczema.  She reports hot flashes and night sweats. She also reports lack of energy/drive such as lack of motivation to cook. Symptoms are interfering with her activities of daily living.  Denies abnormal vaginal bleeding, discharge, pelvic pain, problems with intercourse or other gynecologic concerns.     Gynecologic History No LMP recorded. Pt reports LMP was in 02/2022 or 03/2022; unsure. Contraception: BTL Last Pap: 06/24/2021. Result was normal with negative HPV Last Mammogram: 12/24/2013.  Result was normal Last Colonoscopy: N/A; FIT test done 07/28/2021 negative.  Obstetric History OB History  Gravida Para Term Preterm AB Living  '8 4 4   4 2  '$ SAB IAB Ectopic Multiple Live Births  '1 2 1 '$ 0 2    # Outcome Date GA Lbr Len/2nd Weight Sex Delivery Anes PTL Lv  8 Term 08/22/12 [redacted]w[redacted]d 6 lb 10.4 oz (3.015 kg) F CS-LTranv Spinal  LIV  7 Term 2009 482w0d     ND  6 SAB 2005          5 Term 2002 4047w0d CS-Unspec     4 IAB 1997          3 Ectopic 1993          2 Term 1991 [id number]-Spont     1 IAB 198931        Past Medical History:  Diagnosis Date   GERD (gastroesophageal reflux disease)    Hypertension    Hyperthyroidism    Hypothyroidism    Thyroid disease    hyper and hypo    Past Surgical History:  Procedure Laterality Date   CESAREAN SECTION     CESAREAN SECTION N/A 08/22/2012   Procedure: CESAREAN SECTION;  Surgeon: LisaLahoma Crocker;  Location: [facility];  Service: Obstetrics;  Laterality: N/A;   DILATION AND CURETTAGE OF UTERUS     TUBAL LIGATION Bilateral 08/22/2012    Procedure: BILATERAL TUBAL LIGATION;  Surgeon: LisaLahoma Crocker;  Location[facility];  Service: Obstetrics;  Laterality: Bilateral;    Current Outpatient Medications on File Prior to Visit  Medication Sig Dispense Refill   buPROPion (WELLBUTRIN XL) 150 MG 24 hr tablet Take 150 mg by mouth daily.     cetirizine (ZYRTEC) 10 MG tablet Take 10 mg by mouth daily.     chlorthalidone (HYGROTON) 25 MG tablet Take 25 mg by mouth daily.     hydrALAZINE (APRESOLINE) 50 MG tablet Take 50 mg by mouth 3 (three) times daily.     ibuprofen (ADVIL) 800 MG tablet Take 1 tablet (800 mg total) by mouth every 8 (eight) hours as needed (pain). 21 tablet 0   meloxicam (MOBIC) 15 MG tablet Take one pill a day with food for 7 days and then prn thereafter 40 tablet 2   NIFEdipine (PROCARDIA-XL/NIFEDICAL-XL) 30 MG 24 hr tablet Take 1 tablet (30 mg total) by mouth daily. 30 tablet 0   potassium chloride SA (KLOR-CON M) 20 MEQ tablet Take by mouth.     No current facility-administered medications on  file prior to visit.    Allergies  Allergen Reactions   Condoms - Female [Nonoxynol 9] Hives    Certain brand she was not sure about.    Social History:  reports that she has been smoking cigarettes. She has never used smokeless tobacco. She reports current alcohol use. She reports that she does not use drugs.  Family History  Problem Relation Age of Onset   Hypertension Mother    Diabetes Father    Cancer Father    Hyperlipidemia Neg Hx    Heart attack Neg Hx    Sudden death Neg Hx     The following portions of the patient's history were reviewed and updated as appropriate: allergies, current medications, past family history, past medical history, past social history, past surgical history and problem list.  Review of Systems Pertinent items noted in HPI and remainder of comprehensive ROS otherwise negative.  Physical Exam:  BP 139/85   Pulse 89   Ht '5\' 6"'$  (1.676 m)   Wt 191 lb (86.6 kg)   BMI 30.83  kg/m  CONSTITUTIONAL: Well-developed, well-nourished female in no acute distress.  HENT:  Normocephalic, atraumatic, External right and left ear normal.  EYES: Conjunctivae and EOM are normal. Pupils are equal, round, and reactive to light. No scleral icterus.  NECK: Normal range of motion, supple, no masses.  Normal thyroid.  SKIN: Skin is warm and dry. No rash noted. Not diaphoretic. No erythema. No pallor. MUSCULOSKELETAL: Normal range of motion. No tenderness.  No cyanosis, clubbing, or edema. NEUROLOGIC: Alert and oriented to person, place, and time. Normal reflexes, muscle tone coordination.  PSYCHIATRIC: Normal mood and affect. Normal behavior. Normal judgment and thought content. CARDIOVASCULAR: Normal heart rate noted, regular rhythm RESPIRATORY: Clear to auscultation bilaterally. Effort and breath sounds normal, no problems with respiration noted. BREASTS: Symmetric in size. No masses, tenderness, skin changes, nipple drainage, or lymphadenopathy bilaterally. Performed in the presence of a chaperone. ABDOMEN: Soft, no distention noted.  No tenderness, rebound or guarding.  PELVIC: Normal appearing urethral meatus; normal appearing vaginal mucosa and cervix. Hyperkeratotic, hyperpigmented skin of superior vulva extending to medial aspects of labia majora bilaterally. No abnormal vaginal discharge noted.  Pap smear obtained.  Normal uterine size, no other palpable masses, no uterine or adnexal tenderness.  Performed in the presence of a chaperone.   Assessment and Plan:    1. Colon cancer screening Colon cancer screening is not up to date. Discussed need for colon cancer screening over the age 34. Pt had negative FIT testing in 07/2021; recommend colonoscopy as this is the gold standard. Pt amenable. Referral made to Gastroenterology for colonoscopy.  - Ambulatory referral to Gastroenterology  2. Breast cancer screening by mammogram Mammogram scheduled. - MM 3D SCREENING MAMMOGRAM  BILATERAL BREAST; Future  3. Disorder of skin of vulva Pt reports rash of vulva; area appears hyperkeratotic and hyperpigmented on exam. Suspect inflammatory component, possible eczema. No concern for condyloma acuminata based on presentation. Will prescribe clobetasol to manage symptoms. - clobetasol ointment (TEMOVATE) 0.05 %; Apply to affected area every night for 4 weeks, then every other day for 4 weeks and then twice a week for 4 weeks or until resolution.  Dispense: 60 g; Refill: 5  4. Perimenopausal vasomotor symptoms Patient with bothersome menopausal vasomotor symptoms. Discussed lifestyle interventions such as wearing light clothing, remaining in cool environments, having fan/air conditioner in the room, avoiding hot beverages, etc.  Discussed using hormone therapy and concerns about increased risk of heart  disease, cerebrovascular disease, thromboembolic disease, and breast cancer. Also discussed other medical options such as Paxil, Effexor, Brisdelle, Clonidine, or Neurontin. Also discussed alternative therapies such as herbal remedies but cautioned that most of these products contain phytoestrogens (plant estrogens) in unregulated amounts which can have the same effects on the body as the pharmaceutical estrogen preparations. Also referred her to www.[email or web address].org for other alternative options.  Patient opted for Neurontin for now; prescribed. - gabapentin (NEURONTIN) 600 MG tablet; Take 1 tablet (600 mg total) by mouth at bedtime.  Dispense: 30 tablet; Refill: 3  5. Well woman exam with routine gynecological exam Pap performed in clinic today at pt's request; prior pap in the system done 06/24/2021 was normal. Will follow up results of pap smear and manage accordingly. - Cytology - PAP( [facility])  Routine preventative health maintenance measures emphasized. Please refer to After Visit Summary for other counseling recommendations.      Carmelina Dane, Medical Student 07/27/2022,  4:07 PM   Attestation of Attending Supervision of Student:  I confirm that I have verified the information documented in the medical student's note and that I have also personally performed the history, physical exam and all medical decision making activities.  I have verified that all services and findings are accurately documented in this student's note; and I agree with management and plan as outlined in the documentation. I have also made any necessary editorial changes.   Verita Schneiders, MD, [place] Attending [place], [facility] for [employer], [place]

## 2022-07-27 NOTE — Patient Instructions (Addendum)
Patient with bothersome menopausal vasomotor symptoms. Discussed lifestyle interventions such as wearing light clothing, remaining in cool environments, having fan/air conditioner in the room, avoiding hot beverages etc.  Discussed using hormone therapy and concerns about increased risk of heart disease, cerebrovascular disease, thromboembolic disease,  and breast cancer.  Also discussed other medical options such as Paxil, Effexor, Brisdelle, Clonidine, or Neurontin.   Also discussed alternative therapies such as herbal remedies but cautioned that most of the products contained phytoestrogens (plant estrogens) in unregulated amounts which can have the same effects on the body as the pharmaceutical estrogen preparations.  Also referred her to www.menopause.org for other alternative options.

## 2022-07-27 NOTE — Progress Notes (Signed)
Pt has not had Gyn exam in several years that she recalls. Pt does need mammo and colonoscopy.  Pt is in office for eval of skin tags. Pt states she does have problems/irritation when she is on her cycle. Pt was seen by derm, with no review of condition.  Pt states LMP Oct/Nov but unsure.

## 2022-08-04 LAB — CYTOLOGY - PAP
Comment: NEGATIVE
Diagnosis: UNDETERMINED — AB
High risk HPV: NEGATIVE

## 2022-08-07 ENCOUNTER — Ambulatory Visit: Payer: Medicaid Other | Admitting: Physical Therapy

## 2022-08-07 ENCOUNTER — Encounter: Payer: Self-pay | Admitting: Obstetrics & Gynecology

## 2022-08-07 ENCOUNTER — Encounter: Payer: Self-pay | Admitting: Physical Therapy

## 2022-08-07 DIAGNOSIS — M542 Cervicalgia: Secondary | ICD-10-CM

## 2022-08-07 DIAGNOSIS — R87610 Atypical squamous cells of undetermined significance on cytologic smear of cervix (ASC-US): Secondary | ICD-10-CM | POA: Insufficient documentation

## 2022-08-07 DIAGNOSIS — M6281 Muscle weakness (generalized): Secondary | ICD-10-CM

## 2022-08-07 DIAGNOSIS — M25512 Pain in left shoulder: Secondary | ICD-10-CM
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
## 2022-08-15 ENCOUNTER — Ambulatory Visit: Payer: Medicaid Other | Admitting: Physical Therapy

## 2022-08-15 DIAGNOSIS — M25512 Pain in left shoulder: Secondary | ICD-10-CM

## 2022-08-15 DIAGNOSIS — M6281 Muscle weakness (generalized): Secondary | ICD-10-CM

## 2022-08-15 DIAGNOSIS — M542 Cervicalgia: Secondary | ICD-10-CM

## 2022-08-15 NOTE — Patient Instructions (Signed)
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
## 2022-08-17 ENCOUNTER — Ambulatory Visit: Payer: Medicaid Other | Admitting: Physical Therapy

## 2022-08-17 ENCOUNTER — Encounter: Payer: Self-pay | Admitting: Physical Therapy

## 2022-08-17 DIAGNOSIS — M25512 Pain in left shoulder: Secondary | ICD-10-CM | POA: Diagnosis not present

## 2022-08-17 DIAGNOSIS — M6281 Muscle weakness (generalized): Secondary | ICD-10-CM

## 2022-08-17 DIAGNOSIS — M542 Cervicalgia: Secondary | ICD-10-CM

## 2022-08-24 ENCOUNTER — Ambulatory Visit: Payer: Commercial Managed Care - HMO | Admitting: Obstetrics & Gynecology

## 2022-09-06 ENCOUNTER — Ambulatory Visit: Payer: Medicaid Other | Attending: Family Medicine | Admitting: Physical Therapy

## 2022-09-06 ENCOUNTER — Encounter: Payer: Self-pay | Admitting: Physical Therapy

## 2022-09-06 DIAGNOSIS — M6281 Muscle weakness (generalized): Secondary | ICD-10-CM | POA: Insufficient documentation

## 2022-09-06 DIAGNOSIS — M25512 Pain in left shoulder: Secondary | ICD-10-CM | POA: Insufficient documentation

## 2022-09-06 DIAGNOSIS — M542 Cervicalgia: Secondary | ICD-10-CM | POA: Insufficient documentation
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
## 2022-09-08 ENCOUNTER — Ambulatory Visit
Admission: RE | Admit: 2022-09-08 | Discharge: 2022-09-08 | Disposition: A | Discharge status: Home / Self Care | Payer: Medicaid Other | Source: Ambulatory Visit | Attending: Obstetrics & Gynecology | Admitting: Obstetrics & Gynecology

## 2022-09-08 DIAGNOSIS — Z1231 Encounter for screening mammogram for malignant neoplasm of breast: Secondary | ICD-10-CM
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
## 2022-09-11 ENCOUNTER — Encounter: Payer: Self-pay | Admitting: Physical Therapy

## 2022-09-11 ENCOUNTER — Ambulatory Visit: Payer: Medicaid Other | Admitting: Physical Therapy

## 2022-09-11 DIAGNOSIS — M25512 Pain in left shoulder: Secondary | ICD-10-CM | POA: Diagnosis not present

## 2022-09-11 DIAGNOSIS — M542 Cervicalgia: Secondary | ICD-10-CM

## 2022-09-11 DIAGNOSIS — M6281 Muscle weakness (generalized): Secondary | ICD-10-CM
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
## 2022-09-14 ENCOUNTER — Encounter: Payer: Self-pay | Admitting: Physical Therapy

## 2022-09-14 ENCOUNTER — Ambulatory Visit: Payer: Medicaid Other | Admitting: Physical Therapy

## 2022-09-14 DIAGNOSIS — M25512 Pain in left shoulder: Secondary | ICD-10-CM | POA: Diagnosis not present

## 2022-09-14 DIAGNOSIS — M542 Cervicalgia: Secondary | ICD-10-CM

## 2022-09-14 DIAGNOSIS — M6281 Muscle weakness (generalized): Secondary | ICD-10-CM

## 2022-09-18 ENCOUNTER — Encounter: Payer: Self-pay | Admitting: Physical Therapy

## 2022-09-18 ENCOUNTER — Ambulatory Visit: Payer: Medicaid Other | Admitting: Physical Therapy

## 2022-09-18 DIAGNOSIS — M6281 Muscle weakness (generalized): Secondary | ICD-10-CM

## 2022-09-18 DIAGNOSIS — M25512 Pain in left shoulder: Secondary | ICD-10-CM | POA: Diagnosis not present

## 2022-09-18 DIAGNOSIS — M542 Cervicalgia: Secondary | ICD-10-CM
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
## 2022-09-20 ENCOUNTER — Ambulatory Visit: Payer: Medicaid Other | Admitting: Sports Medicine

## 2022-09-20 VITALS — BP 155/88 | Ht 66.0 in | Wt 193.0 lb

## 2022-09-20 DIAGNOSIS — M79645 Pain in left finger(s): Secondary | ICD-10-CM | POA: Diagnosis present

## 2022-09-20 DIAGNOSIS — S46002A Unspecified injury of muscle(s) and tendon(s) of the rotator cuff of left shoulder, initial encounter: Secondary | ICD-10-CM

## 2022-09-20 NOTE — Patient Instructions (Signed)
VOLTAREN GEL - available over the counter at any pharmacy or online. Apply the gel to the base of the thumb 3-4x per day.  TUMERIC - available over the counter as a supplement at any pharmacy.  Take 1000mg  daily

## 2022-09-20 NOTE — Progress Notes (Unsigned)
  Danielle [name] - 52 y.o. female MRN [id number]  Date of birth: 10-10-1970    CHIEF COMPLAINT:   Follow-up left shoulder pain and left thumb pain    SUBJECTIVE:   HPI:  Pleasant 52 year old female here for follow-up of left cuff tendinitis, left CMC arthritis.  For cuff tendinitis she has been working physical therapy.  She is really pleased with her progress.  She states the pain is getting much better.  She has improved range of motion.  She is happy with conservative treatment of this.  For left CMC arthritis has been wearing a thumb brace.  She has not tried topical Voltaren.  She tries to stay away from over-the-counter medicines.  She would like to discuss more natural remedies.  ROS:     See HPI  PERTINENT  PMH / PSH FH / / SH:  Past Medical, Surgical, Social, and Family History Reviewed & Updated in the EMR.  Pertinent findings include:  none  OBJECTIVE: BP (!) 155/88   Ht 5\' 6"  (1.676 m)   Wt 193 lb (87.5 kg)   BMI 31.15 kg/m   Physical Exam:  Vital signs are reviewed.  GEN: Alert and oriented, NAD Pulm: Breathing unlabored PSY: normal mood, congruent affect  MSK: Left shoulder -no obvious deformity.  Nontender palpation of the [facility] joint or biceps tendon.  Active range of motion abduction 140 degrees, forward flexion 140 degrees, external rotation 65 degrees, internal rotation to lower lumbar spine.  4/5 strength with resisted internal and external rotation.  4/5 strength with supraspinatus testing.  She has negative Hawkins test.  Negative empty can test.  She is neurovascular intact distally.  Left thumb -no effusion or erythema.  Mildly tender to palpation at the base of the thumb at the [facility].  ASSESSMENT & PLAN:  1.  Left cuff tendinitis -Making reasonable progress with physical therapy.  Will renew her physical therapy prescription today.  Will check on her progress in another 6 to 8 weeks.  2.  Left first CMC arthritis -She would like to continue to treat  this conservatively and with natural remedies.  Gave her a new thumb brace today.  She will try adding topical Voltaren and taking a turmeric supplement.  She continues to decline cortisone shot into the [facility] joint.  If this gets worse she may consider that in the future.  Arvella Nigh, MD PGY-4, Sports Medicine Fellow [facility]  Addendum:  I was the preceptor for this visit and available for immediate consultation.  Norton Blizzard MD Marrianne Mood
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
## 2022-09-21 ENCOUNTER — Encounter: Payer: Self-pay | Admitting: Physical Therapy

## 2022-09-21 ENCOUNTER — Ambulatory Visit: Payer: Medicaid Other | Attending: Family Medicine | Admitting: Physical Therapy

## 2022-09-21 DIAGNOSIS — M542 Cervicalgia: Secondary | ICD-10-CM | POA: Diagnosis present

## 2022-09-21 DIAGNOSIS — M25512 Pain in left shoulder: Secondary | ICD-10-CM | POA: Diagnosis present

## 2022-09-21 DIAGNOSIS — M6281 Muscle weakness (generalized): Secondary | ICD-10-CM | POA: Diagnosis present

## 2022-09-28 ENCOUNTER — Encounter: Payer: Self-pay | Admitting: Obstetrics & Gynecology

## 2022-09-28 ENCOUNTER — Ambulatory Visit: Payer: Medicaid Other | Admitting: Obstetrics & Gynecology

## 2022-09-28 VITALS — BP 143/87 | HR 96 | Wt 181.0 lb

## 2022-09-28 DIAGNOSIS — N9089 Other specified noninflammatory disorders of vulva and perineum: Secondary | ICD-10-CM | POA: Diagnosis not present

## 2022-09-28 DIAGNOSIS — N951 Menopausal and female climacteric states: Secondary | ICD-10-CM | POA: Diagnosis not present

## 2022-09-28 DIAGNOSIS — L989 Disorder of the skin and subcutaneous tissue, unspecified: Secondary | ICD-10-CM | POA: Diagnosis not present

## 2022-09-28 NOTE — Progress Notes (Signed)
   GYNECOLOGY OFFICE VISIT NOTE  History:   Danielle [name] is a 52 y.o. [phone] here today for follow up after management of her vulvar skin disorder and perimenopausal symptoms during her visit on 07/27/2022. Was prescribed Clobetasol cream and Neurotin; reports markedly improved symptoms. She denies any abnormal vaginal discharge, bleeding, pelvic pain or other concerns.    Past Medical History:  Diagnosis Date   GERD (gastroesophageal reflux disease)    Hypertension    Hyperthyroidism    Hypothyroidism    Thyroid disease    hyper and hypo    Past Surgical History:  Procedure Laterality Date   CESAREAN SECTION     CESAREAN SECTION N/A 08/22/2012   Procedure: CESAREAN SECTION;  Surgeon: Antionette Char, MD;  Location: [facility];  Service: Obstetrics;  Laterality: N/A;   DILATION AND CURETTAGE OF UTERUS     TUBAL LIGATION Bilateral 08/22/2012   Procedure: BILATERAL TUBAL LIGATION;  Surgeon: Antionette Char, MD;  Location: [facility];  Service: Obstetrics;  Laterality: Bilateral;    The following portions of the patient's history were reviewed and updated as appropriate: allergies, current medications, past family history, past medical history, past social history, past surgical history and problem list.   Health Maintenance:  ASCUS pap and negative HRHPV on 07/27/2022.  Normal mammogram on 09/12/22.   Review of Systems:  Pertinent items noted in HPI and remainder of comprehensive ROS otherwise negative.  Physical Exam:  BP (!) 143/87   Pulse 96   Wt 181 lb (82.1 kg)   LMP 09/21/2022 (Approximate)   BMI 29.21 kg/m  CONSTITUTIONAL: Well-developed, well-nourished female in no acute distress.  MUSCULOSKELETAL: Normal range of motion. No edema noted. NEUROLOGIC: Alert and oriented to person, place, and time. Normal muscle tone coordination. No cranial nerve deficit noted. PSYCHIATRIC: Normal mood and affect. Normal behavior. Normal judgment and thought content. CARDIOVASCULAR: Normal  heart rate noted RESPIRATORY: Effort and breath sounds normal, no problems with respiration noted ABDOMEN: No masses noted. No other overt distention noted.   PELVIC: Normal appearing urethral meatus; normal appearing distal vaginal mucosa. Improved appearance of skin of superior vulva and labia. No abnormal vaginal discharge noted.   Performed in the presence of a chaperone.      Assessment and Plan:    1. Perimenopausal vasomotor symptoms Continue Neurontin and lifestyle interventions.  2. Disorder of skin of vulva Continue Clobetasol as prescribed.    Return for any gynecologic concerns.    I spent 20 minutes dedicated to the care of this patient including pre-visit review of records, face to face time with the patient discussing her conditions and treatments and post visit orders.    Jaynie Collins, MD, FACOG Obstetrician & Gynecologist, [facility] for [employer], [facility]

## 2022-09-28 NOTE — Patient Instructions (Signed)
[facility]: [phone]
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
## 2022-10-12 ENCOUNTER — Encounter: Payer: Self-pay | Admitting: Physical Therapy

## 2022-10-12 ENCOUNTER — Ambulatory Visit: Payer: Medicaid Other | Admitting: Physical Therapy

## 2022-10-12 DIAGNOSIS — M6281 Muscle weakness (generalized): Secondary | ICD-10-CM

## 2022-10-12 DIAGNOSIS — M25512 Pain in left shoulder: Secondary | ICD-10-CM

## 2022-10-12 DIAGNOSIS — M542 Cervicalgia: Secondary | ICD-10-CM

## 2022-10-19 ENCOUNTER — Ambulatory Visit: Payer: Medicaid Other | Admitting: Physical Therapy
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
## 2022-10-26 ENCOUNTER — Ambulatory Visit: Payer: Medicaid Other | Attending: Family Medicine | Admitting: Physical Therapy

## 2022-10-26 ENCOUNTER — Encounter: Payer: Self-pay | Admitting: Physical Therapy

## 2022-10-26 DIAGNOSIS — M542 Cervicalgia: Secondary | ICD-10-CM | POA: Insufficient documentation

## 2022-10-26 DIAGNOSIS — M25512 Pain in left shoulder: Secondary | ICD-10-CM | POA: Insufficient documentation

## 2022-10-26 DIAGNOSIS — M6281 Muscle weakness (generalized): Secondary | ICD-10-CM | POA: Diagnosis present
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
## 2022-10-30 ENCOUNTER — Ambulatory Visit: Payer: Medicaid Other | Admitting: Physical Therapy

## 2022-10-30 ENCOUNTER — Encounter: Payer: Self-pay | Admitting: Physical Therapy

## 2022-10-30 DIAGNOSIS — M6281 Muscle weakness (generalized): Secondary | ICD-10-CM

## 2022-10-30 DIAGNOSIS — M25512 Pain in left shoulder: Secondary | ICD-10-CM

## 2022-10-30 DIAGNOSIS — M542 Cervicalgia: Secondary | ICD-10-CM

## 2022-11-02 ENCOUNTER — Ambulatory Visit: Payer: Medicaid Other | Admitting: Physical Therapy

## 2022-11-06 ENCOUNTER — Ambulatory Visit: Payer: Medicaid Other | Admitting: Physical Therapy

## 2022-11-06 ENCOUNTER — Encounter: Payer: Self-pay | Admitting: Physical Therapy

## 2022-11-06 DIAGNOSIS — M25512 Pain in left shoulder: Secondary | ICD-10-CM

## 2022-11-06 DIAGNOSIS — M6281 Muscle weakness (generalized): Secondary | ICD-10-CM

## 2022-11-06 DIAGNOSIS — M542 Cervicalgia: Secondary | ICD-10-CM
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
## 2022-11-09 ENCOUNTER — Ambulatory Visit: Payer: Medicaid Other | Admitting: Physical Therapy

## 2022-11-13 ENCOUNTER — Encounter: Payer: Medicaid Other | Admitting: Physical Therapy

## 2022-11-16 ENCOUNTER — Ambulatory Visit: Payer: Medicaid Other | Admitting: Physical Therapy

## 2022-11-30 ENCOUNTER — Telehealth: Payer: Self-pay | Admitting: Physical Therapy

## 2022-11-30 ENCOUNTER — Ambulatory Visit: Payer: Medicaid Other | Attending: Family Medicine | Admitting: Physical Therapy

## 2022-11-30 NOTE — Telephone Encounter (Signed)
Called pt re: this afternoon's missed appt. Left voicemail confirming date/time of next appt and left office call back number for any scheduling needs or questions/concerns.

## 2022-12-07 ENCOUNTER — Ambulatory Visit: Payer: Medicaid Other | Admitting: Physical Therapy

## 2022-12-07 ENCOUNTER — Telehealth: Payer: Self-pay | Admitting: Physical Therapy

## 2022-12-07 NOTE — Telephone Encounter (Signed)
Called pt re: this afternoon's missed appt - left voicemail, no further appts scheduled at this time, provided office call back number for any questions/concerns

## 2022-12-12 ENCOUNTER — Encounter (HOSPITAL_COMMUNITY): Payer: Self-pay | Admitting: *Deleted

## 2022-12-12 ENCOUNTER — Other Ambulatory Visit: Payer: Self-pay

## 2022-12-12 ENCOUNTER — Ambulatory Visit (HOSPITAL_COMMUNITY)
Admission: EM | Admit: 2022-12-12 | Discharge: 2022-12-12 | Disposition: A | Discharge status: Home / Self Care | Payer: Medicaid Other | Attending: Family Medicine | Admitting: Family Medicine

## 2022-12-12 DIAGNOSIS — I10 Essential (primary) hypertension: Secondary | ICD-10-CM | POA: Insufficient documentation

## 2022-12-12 DIAGNOSIS — R42 Dizziness and giddiness: Secondary | ICD-10-CM | POA: Insufficient documentation

## 2022-12-12 LAB — BASIC METABOLIC PANEL
Anion gap: 14 (ref 5–15)
BUN: 10 mg/dL (ref 6–20)
CO2: 27 mmol/L (ref 22–32)
Calcium: 9.2 mg/dL (ref 8.9–10.3)
Chloride: 94 mmol/L — ABNORMAL LOW (ref 98–111)
Creatinine, Ser: 0.87 mg/dL (ref 0.44–1.00)
GFR, Estimated: >60 mL/min (ref >60)
Glucose, Bld: 87 mg/dL (ref 70–99)
Potassium: 2.8 mmol/L — ABNORMAL LOW (ref 3.5–5.1)
Sodium: 135 mmol/L (ref 135–145)

## 2022-12-12 LAB — CBC
HCT: 42.2 % (ref 36.0–46.0)
Hemoglobin: 14 g/dL (ref 12.0–15.0)
MCH: 29.7 pg (ref 26.0–34.0)
MCHC: 33.2 g/dL (ref 30.0–36.0)
MCV: 89.6 fL (ref 80.0–100.0)
Platelets: 370 10*3/uL (ref 150–400)
RBC: 4.71 MIL/uL (ref 3.87–5.11)
RDW: 14.8 % (ref 11.5–15.5)
WBC: 7.6 10*3/uL (ref 4.0–10.5)
nRBC: 0 % (ref 0.0–0.2)

## 2022-12-12 NOTE — ED Triage Notes (Signed)
Pt states she was dizzy yesterday at a clients house and then again today. She states this has started to happen when she takes her BP meds.

## 2022-12-12 NOTE — ED Provider Notes (Signed)
[facility]    CSN: [id number] Arrival date & time: 12/12/22  1239      History   Chief Complaint Chief Complaint  Patient presents with   Dizziness    HPI Danielle [name] is a 52 y.o. female.    Dizziness Here for dizziness that bothers her a good bit yesterday for between 4 and 8 hours and then has been bothering her for about 6 hours today.  It is mainly when she is up and moving about, and she feels a little lightheaded dizziness in her eyes and some off-balance.  No nausea or vomiting or diarrhea.  No fever or chills or upper respiratory symptoms.  For hypertension she takes nifedipine 60 mg, chlorthalidone 25 mg and hydralazine 50 mg twice a day.  She states her blood pressure when she is checked it during these episodes has been good, 120/80.    Past Medical History:  Diagnosis Date   GERD (gastroesophageal reflux disease)    Hypertension    Hyperthyroidism    Hypothyroidism    Thyroid disease    hyper and hypo    Patient Active Problem List   Diagnosis Date Noted   Thumb pain, left 09/20/2022   ASCUS of cervix with negative high risk HPV on 07/27/2022 08/07/2022   Fall 02/13/2013    Past Surgical History:  Procedure Laterality Date   CESAREAN SECTION     CESAREAN SECTION N/A 08/22/2012   Procedure: CESAREAN SECTION;  Surgeon: Antionette Char, MD;  Location: [facility];  Service: Obstetrics;  Laterality: N/A;   DILATION AND CURETTAGE OF UTERUS     TUBAL LIGATION Bilateral 08/22/2012   Procedure: BILATERAL TUBAL LIGATION;  Surgeon: Antionette Char, MD;  Location: [facility];  Service: Obstetrics;  Laterality: Bilateral;    OB History     Gravida  8   Para  4   Term  4   Preterm      AB  4   Living  2      SAB  1   IAB  2   Ectopic  1   Multiple  0   Live Births  2            Home Medications    Prior to Admission medications   Medication Sig Start Date End Date Taking? Authorizing Provider  cetirizine (ZYRTEC) 10 MG  tablet Take 10 mg by mouth daily.   Yes [provider]  chlorthalidone (HYGROTON) 25 MG tablet Take 25 mg by mouth daily.   Yes [provider]  clobetasol ointment (TEMOVATE) 0.05 % Apply to affected area every night for 4 weeks, then every other day for 4 weeks and then twice a week for 4 weeks or until resolution. 07/27/22  Yes Anyanwu, Jethro Bastos, MD  hydrALAZINE (APRESOLINE) 50 MG tablet Take 50 mg by mouth in the morning and at bedtime.   Yes [provider]  meloxicam (MOBIC) 15 MG tablet Take one pill a day with food for 7 days and then prn thereafter 06/05/22  Yes Rafoth, Baldemar Friday, MD  potassium chloride SA (KLOR-CON M) 20 MEQ tablet Take by mouth. 04/06/22 12/12/22 Yes [provider]  buPROPion (WELLBUTRIN XL) 150 MG 24 hr tablet Take 150 mg by mouth daily.    [provider]  gabapentin (NEURONTIN) 600 MG tablet Take 1 tablet (600 mg total) by mouth at bedtime. 07/27/22   Anyanwu, Jethro Bastos, MD  NIFEdipine (PROCARDIA-XL/NIFEDICAL-XL) 30 MG 24 hr tablet Take  1 tablet (30 mg total) by mouth daily. 11/05/21   Long, [name], MD    Family History Family History  Problem Relation Age of Onset   Hypertension Mother    Diabetes Father    Cancer Father    Hyperlipidemia Neg Hx    Heart attack Neg Hx    Sudden death Neg Hx     Social History Social History   Tobacco Use   Smoking status: Every Day    Types: Cigarettes   Smokeless tobacco: Never  Vaping Use   Vaping status: Never Used  Substance Use Topics   Alcohol use: Yes    Comment: occ   Drug use: No     Allergies   Condoms - female [nonoxynol 9]   Review of Systems Review of Systems  Neurological:  Positive for dizziness.     Physical Exam Triage Vital Signs ED Triage Vitals  Encounter Vitals Group     BP 12/12/22 1405 (!) 143/89     Systolic BP Percentile --      Diastolic BP Percentile --      Pulse Rate 12/12/22 1405 82     Resp 12/12/22 1405 18     Temp 12/12/22  1405 98.8 F (37.1 C)     Temp Source 12/12/22 1405 Oral     SpO2 12/12/22 1405 98 %     Weight --      Height --      Head Circumference --      Peak Flow --      Pain Score 12/12/22 1401 0     Pain Loc --      Pain Education --      Exclude from Growth Chart --    No data found.  Updated Vital Signs BP (!) 143/89 (BP Location: Right Arm)   Pulse 82   Temp 98.8 F (37.1 C) (Oral)   Resp 18   LMP 11/21/2022 (Approximate)   SpO2 98%   Visual Acuity Right Eye Distance:   Left Eye Distance:   Bilateral Distance:    Right Eye Near:   Left Eye Near:    Bilateral Near:     Physical Exam Vitals reviewed.  Constitutional:      General: She is not in acute distress.    Appearance: She is not toxic-appearing.  HENT:     Mouth/Throat:     Mouth: Mucous membranes are moist.     Pharynx: No oropharyngeal exudate or posterior oropharyngeal erythema.  Eyes:     Extraocular Movements: Extraocular movements intact.     Conjunctiva/sclera: Conjunctivae normal.     Pupils: Pupils are equal, round, and reactive to light.  Cardiovascular:     Rate and Rhythm: Normal rate and regular rhythm.     Heart sounds: No murmur heard. Pulmonary:     Effort: Pulmonary effort is normal. No respiratory distress.     Breath sounds: No stridor. No wheezing, rhonchi or rales.  Musculoskeletal:     Cervical back: Neck supple.  Lymphadenopathy:     Cervical: No cervical adenopathy.  Skin:    Capillary Refill: Capillary refill takes less than 2 seconds.     Coloration: Skin is not jaundiced or pale.  Neurological:     General: No focal deficit present.     Mental Status: She is alert and oriented to person, place, and time.  Psychiatric:        Behavior: Behavior normal.      UC  Treatments / Results  Labs (all labs ordered are listed, but only abnormal results are displayed) Labs Reviewed  CBC  BASIC METABOLIC PANEL    EKG   Radiology No results  found.  Procedures Procedures (including critical care time)  Medications Ordered in UC Medications - No data to display  Initial Impression / Assessment and Plan / UC Course  I have reviewed the triage vital signs and the nursing notes.  Pertinent labs & imaging results that were available during my care of the patient were reviewed by me and considered in my medical decision making (see chart for details).        EKG is normal sinus rhythm with a possible first-degree AV block.  No significant ST segment changes or and no ectopic beats.  CBC and BMP are drawn today.  We will let her know if anything is significantly abnormal.  She is going to push oral fluids and I have asked her to only take one half of her chlorthalidone daily for the next 2 days.   she is going to follow-up with her primary care.   Final Clinical Impressions(s) / UC Diagnoses   Final diagnoses:  Dizziness  Essential hypertension     Discharge Instructions      Your EKG did not show any serious pathology.  For the next 2 days take one half of your chlorthalidone once daily.  Continue to check your blood pressure as you have been doing.  Push oral fluids in the next few days.  Please follow-up with your primary care.     ED Prescriptions   None    PDMP not reviewed this encounter.   Zenia Resides, MD 12/12/22 [phone]

## 2022-12-12 NOTE — Discharge Instructions (Signed)
Your EKG did not show any serious pathology.  For the next 2 days take one half of your chlorthalidone once daily.  Continue to check your blood pressure as you have been doing.  Push oral fluids in the next few days.  Please follow-up with your primary care.

## 2022-12-13 ENCOUNTER — Telehealth: Payer: Self-pay | Admitting: Emergency Medicine

## 2022-12-13 MED ORDER — POTASSIUM CHLORIDE CRYS ER 20 MEQ PO TBCR: 20.0000 meq | EXTENDED_RELEASE_TABLET | Freq: Every day | ORAL | 0 refills | Status: AC

## 2022-12-28 ENCOUNTER — Ambulatory Visit: Payer: Medicaid Other | Admitting: Physical Therapy

## 2023-02-07 ENCOUNTER — Ambulatory Visit (INDEPENDENT_AMBULATORY_CARE_PROVIDER_SITE_OTHER): Payer: Medicaid Other

## 2023-02-07 ENCOUNTER — Encounter: Payer: Self-pay | Admitting: Podiatry

## 2023-02-07 ENCOUNTER — Ambulatory Visit: Payer: Medicaid Other | Admitting: Podiatry

## 2023-02-07 DIAGNOSIS — M21612 Bunion of left foot: Secondary | ICD-10-CM

## 2023-02-07 DIAGNOSIS — D2372 Other benign neoplasm of skin of left lower limb, including hip: Secondary | ICD-10-CM

## 2023-02-07 DIAGNOSIS — D492 Neoplasm of unspecified behavior of bone, soft tissue, and skin: Secondary | ICD-10-CM | POA: Diagnosis not present

## 2023-02-07 DIAGNOSIS — M216X2 Other acquired deformities of left foot: Secondary | ICD-10-CM | POA: Diagnosis not present

## 2023-02-07 NOTE — Progress Notes (Signed)
Subjective:  Patient ID: Danielle [name], female    DOB: 22-Jan-1971,   MRN: [id number]  No chief complaint on file.   52 y.o. female presents for concern of left bunion pain. Relates this has been ongoing for years relates recently the pain in her arch and foot have been worsening. Has tried different shoes padding and spacers with little relief. Also relates a callus on her foot that has been painful.   . Denies any other pedal complaints. Denies n/v/f/c.   Past Medical History:  Diagnosis Date   GERD (gastroesophageal reflux disease)    Hypertension    Hyperthyroidism    Hypothyroidism    Thyroid disease    hyper and hypo    Objective:  Physical Exam: Vascular: DP/PT pulses 2/4 bilateral. CFT <3 seconds. Normal hair growth on digits. No edema.  Skin. No lacerations or abrasions bilateral feet. Hyperkeratotic cored lesion with disruption of skin lines on left plantar fifth metatarsal head Musculoskeletal: MMT 5/5 bilateral lower extremities in DF, PF, Inversion and Eversion. Deceased ROM in DF of ankle joint. 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 No pain with ROM of the first MPJ and no pain to palpation. No pain along arch or medial calcaneal tubercle.  Neurological: Sensation intact to light touch.   Assessment:   1. Bunion, left foot   2. Benign neoplasm of skin of foot, left      Plan:  Patient was evaluated and treated and all questions answered. -Xrays reviewed Moderate bunion deformity with IM 1-2 angle of about 13 degrees. No degenerative changes noted at the first MPJ.   -Discussed HAV and treatment options;conservative and surgical management; risks, benefits, alternatives discussed. All patient's questions answered. -Discussed padding and wide shoe gear.   -Recommend continue with good supportive shoes and inserts.  -Discussed surgical options if needed in the future.  -Discussed  benign lesions.  with patient and treatment options.   -Hyperkeratotic tissue was debrided with chisel without incident.  -Applied salycylic acid treatment to area with dressing. Advised to remove bandaging tomorrow.  -Encouraged daily moisturizing -Discussed use of pumice stone -Advised good supportive shoes and inserts -Patient to return to office as needed or sooner if condition worsens.  -Patient to return to office as needed or sooner if condition worsens.   Louann Sjogren, DPM

## 2023-04-30 ENCOUNTER — Ambulatory Visit: Payer: Medicaid Other | Admitting: Orthopedic Surgery

## 2023-05-07 ENCOUNTER — Other Ambulatory Visit: Payer: Self-pay

## 2023-05-07 ENCOUNTER — Ambulatory Visit (HOSPITAL_COMMUNITY)
Admission: EM | Admit: 2023-05-07 | Discharge: 2023-05-07 | Disposition: A | Discharge status: Home / Self Care | Payer: Medicaid Other | Attending: Internal Medicine | Admitting: Internal Medicine

## 2023-05-07 ENCOUNTER — Ambulatory Visit (INDEPENDENT_AMBULATORY_CARE_PROVIDER_SITE_OTHER): Payer: Medicaid Other

## 2023-05-07 ENCOUNTER — Encounter (HOSPITAL_COMMUNITY): Payer: Self-pay | Admitting: Emergency Medicine

## 2023-05-07 DIAGNOSIS — M778 Other enthesopathies, not elsewhere classified: Secondary | ICD-10-CM

## 2023-05-07 MED ORDER — IBUPROFEN 800 MG PO TABS: 800.0000 mg | ORAL_TABLET | Freq: Three times a day (TID) | ORAL | 0 refills | Status: DC | PRN

## 2023-05-07 MED ORDER — ELBOW COPPER-INFUSED SLEEVE MISC: 1.0000 | 0 refills | Status: AC | PRN

## 2023-05-07 NOTE — ED Provider Notes (Addendum)
[facility]    CSN: [id number] Arrival date & time: 05/07/23  1533      History   Chief Complaint Chief Complaint  Patient presents with   Elbow Pain    HPI Danielle [name] is a 52 y.o. female comes to the urgent care with right elbow pain of 2 days duration.  Symptom onset was fairly abrupt and has been persistent.  Pain is currently constant, sharp and throbbing, 10 out of 10 and aggravated by flexing the elbow or palpating the area involved.  It is not associated with bruising.  It is associated with swelling on the medial aspect of the right elbow.  No bruising.  Patient denies any trauma to the elbow.  No fever or chills.  No other joints involved.  Patient denies heavy lifting.  He does not play tennis or any recreational sport.   HPI  Past Medical History:  Diagnosis Date   GERD (gastroesophageal reflux disease)    Hypertension    Hyperthyroidism    Hypothyroidism    Thyroid disease    hyper and hypo    Patient Active Problem List   Diagnosis Date Noted   Thumb pain, left 09/20/2022   ASCUS of cervix with negative high risk HPV on 07/27/2022 08/07/2022   Fall 02/13/2013    Past Surgical History:  Procedure Laterality Date   CESAREAN SECTION     CESAREAN SECTION N/A 08/22/2012   Procedure: CESAREAN SECTION;  Surgeon: Antionette Char, MD;  Location: [facility];  Service: Obstetrics;  Laterality: N/A;   DILATION AND CURETTAGE OF UTERUS     TUBAL LIGATION Bilateral 08/22/2012   Procedure: BILATERAL TUBAL LIGATION;  Surgeon: Antionette Char, MD;  Location: [facility];  Service: Obstetrics;  Laterality: Bilateral;    OB History     Gravida  8   Para  4   Term  4   Preterm      AB  4   Living  2      SAB  1   IAB  2   Ectopic  1   Multiple  0   Live Births  2            Home Medications    Prior to Admission medications   Medication Sig Start Date End Date Taking? Authorizing Provider  Elastic Bandages & Supports (ELBOW  COPPER-INFUSED SLEEVE) MISC 1 Application by Does not apply route as needed. 05/07/23  Yes Anders Hohmann, Britta Mccreedy, MD  ibuprofen (ADVIL) 800 MG tablet Take 1 tablet (800 mg total) by mouth every 8 (eight) hours as needed. 05/07/23  Yes Cadel Stairs, Britta Mccreedy, MD  buPROPion (WELLBUTRIN XL) 150 MG 24 hr tablet Take 150 mg by mouth daily.    [provider]  cetirizine (ZYRTEC) 10 MG tablet Take 10 mg by mouth daily.    [provider]  chlorthalidone (HYGROTON) 25 MG tablet Take 25 mg by mouth daily.    [provider]  clobetasol ointment (TEMOVATE) 0.05 % Apply to affected area every night for 4 weeks, then every other day for 4 weeks and then twice a week for 4 weeks or until resolution. 07/27/22   Anyanwu, Jethro Bastos, MD  gabapentin (NEURONTIN) 600 MG tablet Take 1 tablet (600 mg total) by mouth at bedtime. Patient not taking: Reported on 05/07/2023 07/27/22   Tereso Newcomer, MD  hydrALAZINE (APRESOLINE) 50 MG tablet Take 50 mg by mouth in the morning and at bedtime.    [provider]  NIFEdipine (PROCARDIA-XL/NIFEDICAL-XL) 30 MG 24 hr tablet Take 1 tablet (30 mg total) by mouth daily. 11/05/21   Long, [name], MD  potassium chloride SA (KLOR-CON M) 20 MEQ tablet Take 1 tablet (20 mEq total) by mouth daily. 12/13/22   Theadora Rama Scales, PA-C    Family History Family History  Problem Relation Age of Onset   Hypertension Mother    Diabetes Father    Cancer Father    Hyperlipidemia Neg Hx    Heart attack Neg Hx    Sudden death Neg Hx     Social History Social History   Tobacco Use   Smoking status: Every Day    Types: Cigarettes   Smokeless tobacco: Never  Vaping Use   Vaping status: Never Used  Substance Use Topics   Alcohol use: Yes    Comment: occ   Drug use: No     Allergies   Condoms - female [nonoxynol 9]   Review of Systems Review of Systems As per HPI  Physical Exam Triage Vital Signs ED Triage Vitals  Encounter Vitals Group     BP  05/07/23 1641 133/89     Systolic BP Percentile --      Diastolic BP Percentile --      Pulse Rate 05/07/23 1641 99     Resp 05/07/23 1641 18     Temp 05/07/23 1641 98.8 F (37.1 C)     Temp Source 05/07/23 1641 Oral     SpO2 05/07/23 1641 97 %     Weight --      Height --      Head Circumference --      Peak Flow --      Pain Score 05/07/23 1638 9     Pain Loc --      Pain Education --      Exclude from Growth Chart --    No data found.  Updated Vital Signs BP 133/89 (BP Location: Left Arm)   Pulse 99   Temp 98.8 F (37.1 C) (Oral)   Resp 18   LMP  (LMP Unknown) Comment: did have a period in 2024  SpO2 97%   Visual Acuity Right Eye Distance:   Left Eye Distance:   Bilateral Distance:    Right Eye Near:   Left Eye Near:    Bilateral Near:     Physical Exam Vitals and nursing note reviewed.  Constitutional:      Appearance: Normal appearance.  Cardiovascular:     Rate and Rhythm: Normal rate and regular rhythm.  Musculoskeletal:     Comments: Full range of motion of the right elbow with pain.  Patient can supinate and pronate the forearm with some pain over the medial condyle.  Neurological:     Mental Status: She is alert.      UC Treatments / Results  Labs (all labs ordered are listed, but only abnormal results are displayed) Labs Reviewed - No data to display  EKG   Radiology No results found.  Procedures Procedures (including critical care time)  Medications Ordered in UC Medications - No data to display  Initial Impression / Assessment and Plan / UC Course  I have reviewed the triage vital signs and the nursing notes.  Pertinent labs & imaging results that were available during my care of the patient were reviewed by me and considered in my medical decision making (see chart for details).     1.  Medial epicondylitis:  Ibuprofen 800 mg every 8 hours as needed for pain Icing of the area involved X-ray of the right elbow is negative for  fracture-no need to follow-up with patient. Gentle range of motion exercises as the pain improves Elbow sleeve as needed Return precautions given Final Clinical Impressions(s) / UC Diagnoses   Final diagnoses:  Right elbow tendinitis     Discharge Instructions      Apply a compressive ACE bandage. Rest and elevate the affected painful area.  Apply cold compresses intermittently as needed.  As pain recedes, begin normal activities slowly as tolerated.  Call if symptoms persist. Please take medications as prescribed X-ray of the right elbow is negative for fracture.    ED Prescriptions     Medication Sig Dispense Auth. Provider   ibuprofen (ADVIL) 800 MG tablet Take 1 tablet (800 mg total) by mouth every 8 (eight) hours as needed. 21 tablet Smith Potenza, Britta Mccreedy, MD   Elastic Bandages & Supports (ELBOW COPPER-INFUSED SLEEVE) MISC 1 Application by Does not apply route as needed. 1 each Randen Kauth, Britta Mccreedy, MD      PDMP not reviewed this encounter.   Merrilee Jansky, MD 05/07/23 [id number]    Merrilee Jansky, MD 05/07/23 [phone]

## 2023-05-07 NOTE — Discharge Instructions (Addendum)
Apply a compressive ACE bandage. Rest and elevate the affected painful area.  Apply cold compresses intermittently as needed.  As pain recedes, begin normal activities slowly as tolerated.  Call if symptoms persist. Please take medications as prescribed X-ray of the right elbow is negative for fracture.

## 2023-05-07 NOTE — ED Triage Notes (Signed)
Right elbow soreness noticed on Friday or Saturday.  No known injury.  Patient is right handed.    Medial right elbow pain, swelling and soreness    Has not taken any pain medicines or used any rubs on arm.  Patient describes pain as throbbing

## 2023-07-30 ENCOUNTER — Ambulatory Visit: Admitting: Family Medicine

## 2023-07-30 VITALS — BP 151/91 | Ht 66.0 in | Wt 177.0 lb

## 2023-07-30 DIAGNOSIS — G8929 Other chronic pain: Secondary | ICD-10-CM | POA: Diagnosis not present

## 2023-07-30 DIAGNOSIS — M79645 Pain in left finger(s): Secondary | ICD-10-CM | POA: Diagnosis not present

## 2023-07-30 DIAGNOSIS — M25512 Pain in left shoulder: Secondary | ICD-10-CM | POA: Diagnosis present

## 2023-07-30 NOTE — Patient Instructions (Signed)
 Use the thumb brace for support. Heat 15 minutes at a time 3-4 times a day as needed. Ibuprofen or aleve as needed for pain and inflammation. Consider injection if this is severe enough.  Start physical therapy for your left shoulder. Do home exercises on days you don't go to therapy. Consider an MRI if you're not improving as expected. Follow up in 6 weeks.

## 2023-07-31 ENCOUNTER — Encounter: Payer: Self-pay | Admitting: Family Medicine
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
## 2023-09-05 ENCOUNTER — Other Ambulatory Visit: Payer: Self-pay

## 2023-09-05 ENCOUNTER — Ambulatory Visit: Attending: Family Medicine | Admitting: Physical Therapy

## 2023-09-05 ENCOUNTER — Encounter: Payer: Self-pay | Admitting: Physical Therapy

## 2023-09-05 DIAGNOSIS — M542 Cervicalgia: Secondary | ICD-10-CM | POA: Diagnosis present

## 2023-09-05 DIAGNOSIS — M25512 Pain in left shoulder: Secondary | ICD-10-CM | POA: Diagnosis present

## 2023-09-05 DIAGNOSIS — G8929 Other chronic pain: Secondary | ICD-10-CM | POA: Insufficient documentation

## 2023-09-05 DIAGNOSIS — M6281 Muscle weakness (generalized): Secondary | ICD-10-CM | POA: Diagnosis present
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
## 2023-09-13 ENCOUNTER — Encounter: Payer: Self-pay | Admitting: Physical Therapy

## 2023-09-13 ENCOUNTER — Ambulatory Visit: Admitting: Physical Therapy

## 2023-09-13 DIAGNOSIS — M25512 Pain in left shoulder: Secondary | ICD-10-CM

## 2023-09-13 DIAGNOSIS — M542 Cervicalgia: Secondary | ICD-10-CM

## 2023-09-13 DIAGNOSIS — M6281 Muscle weakness (generalized): Secondary | ICD-10-CM
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
## 2023-09-26 ENCOUNTER — Ambulatory Visit: Attending: Family Medicine | Admitting: Physical Therapy

## 2023-09-26 ENCOUNTER — Encounter: Payer: Self-pay | Admitting: Physical Therapy

## 2023-09-26 DIAGNOSIS — M25512 Pain in left shoulder: Secondary | ICD-10-CM | POA: Insufficient documentation

## 2023-09-26 DIAGNOSIS — M542 Cervicalgia: Secondary | ICD-10-CM | POA: Insufficient documentation

## 2023-09-26 DIAGNOSIS — M6281 Muscle weakness (generalized): Secondary | ICD-10-CM | POA: Insufficient documentation
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
## 2023-10-03 ENCOUNTER — Ambulatory Visit: Admitting: Physical Therapy

## 2023-10-03 ENCOUNTER — Encounter: Payer: Self-pay | Admitting: Physical Therapy

## 2023-10-03 DIAGNOSIS — M25512 Pain in left shoulder: Secondary | ICD-10-CM | POA: Diagnosis not present

## 2023-10-03 DIAGNOSIS — M6281 Muscle weakness (generalized): Secondary | ICD-10-CM

## 2023-10-03 DIAGNOSIS — M542 Cervicalgia: Secondary | ICD-10-CM

## 2023-10-09 ENCOUNTER — Ambulatory Visit: Admitting: Physical Therapy

## 2023-10-09 ENCOUNTER — Encounter: Payer: Self-pay | Admitting: Physical Therapy

## 2023-10-09 DIAGNOSIS — M25512 Pain in left shoulder: Secondary | ICD-10-CM

## 2023-10-09 DIAGNOSIS — M542 Cervicalgia: Secondary | ICD-10-CM

## 2023-10-09 DIAGNOSIS — M6281 Muscle weakness (generalized): Secondary | ICD-10-CM

## 2023-10-09 NOTE — Patient Instructions (Signed)
 Trigger Point Dry Needling  What is Trigger Point Dry Needling (DN)? DN is a physical therapy technique used to treat muscle pain and dysfunction. Specifically, DN helps deactivate muscle trigger points (muscle knots).  A thin filiform needle is used to penetrate the skin and stimulate the underlying trigger point. The goal is for a local twitch response (LTR) to occur and for the trigger point to relax. No medication of any kind is injected during the procedure.   What Does Trigger Point Dry Needling Feel Like?  The procedure feels different for each individual patient. Some patients report that they do not actually feel the needle enter the skin and overall the process is not painful. Very mild bleeding may occur. However, many patients feel a deep cramping in the muscle in which the needle was inserted. This is the local twitch response.   How Will I feel after the treatment? Soreness is normal, and the onset of soreness may not occur for a few hours. Typically this soreness does not last longer than two days.  Bruising is uncommon, however; ice can be used to decrease any possible bruising.  In rare cases feeling tired or nauseous after the treatment is normal. In addition, your symptoms may get worse before they get better, this period will typically not last longer than 24 hours.   What Can I do After My Treatment? Increase your hydration by drinking more water for the next 24 hours.  You may place ice or heat on the areas treated that have become sore, however, do not use heat on inflamed or bruised areas. Heat often brings more relief post needling. You can continue your regular activities, but vigorous activity is not recommended initially after the treatment for 24 hours. DN is best combined with other physical therapy such as strengthening, stretching, and other therapies.   What are the complications? While your therapist has had extensive training in minimizing the risks of trigger  point dry needling, it is important to understand the risks of any procedure.  Risks include bleeding, pain, fatigue, hematoma, infection, vertigo, nausea or nerve involvement. Monitor for any changes to your skin or sensation. Contact your therapist or MD with concerns.  A rare but serious complication is a pneumothorax over or near your middle and upper chest and back If you have dry needling in this area, monitor for the following symptoms: Shortness of breath on exertion and/or Difficulty taking a deep breath and/or Chest Pain and/or A dry cough If any of the above symptoms develop, please go to the nearest emergency room or call 911. Tell them you had dry needling over your thorax and report any symptoms you are having. Please follow-up with your treating therapist after you complete the medical evaluation.  Sharlet Dawson, PT, A[facility] Certified Exercise Expert for the Aging Adult  10/09/23 3:09 PM Phone: [phone] Fax: [phone]
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
## 2023-10-17 ENCOUNTER — Encounter: Payer: Self-pay | Admitting: Physical Therapy

## 2023-10-17 ENCOUNTER — Ambulatory Visit: Admitting: Physical Therapy

## 2023-10-17 DIAGNOSIS — M25512 Pain in left shoulder: Secondary | ICD-10-CM

## 2023-10-17 DIAGNOSIS — M542 Cervicalgia: Secondary | ICD-10-CM

## 2023-10-17 DIAGNOSIS — M6281 Muscle weakness (generalized): Secondary | ICD-10-CM
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
## 2023-10-23 ENCOUNTER — Ambulatory Visit: Admitting: Physical Therapy

## 2023-11-12 ENCOUNTER — Ambulatory Visit: Attending: Family Medicine | Admitting: Physical Therapy

## 2023-11-12 ENCOUNTER — Encounter: Payer: Self-pay | Admitting: Physical Therapy

## 2023-11-12 DIAGNOSIS — M25512 Pain in left shoulder: Secondary | ICD-10-CM | POA: Diagnosis present

## 2023-11-12 DIAGNOSIS — M542 Cervicalgia: Secondary | ICD-10-CM | POA: Diagnosis present

## 2023-11-12 DIAGNOSIS — M6281 Muscle weakness (generalized): Secondary | ICD-10-CM | POA: Diagnosis present
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
## 2023-11-19 ENCOUNTER — Ambulatory Visit: Admitting: Physical Therapy

## 2023-11-27 ENCOUNTER — Encounter: Admitting: Physical Therapy

## 2023-12-13 ENCOUNTER — Encounter (HOSPITAL_COMMUNITY): Payer: Self-pay | Admitting: *Deleted

## 2023-12-13 ENCOUNTER — Ambulatory Visit (HOSPITAL_COMMUNITY)
Admission: EM | Admit: 2023-12-13 | Discharge: 2023-12-13 | Disposition: A | Discharge status: Home / Self Care | Attending: Family Medicine | Admitting: Family Medicine

## 2023-12-13 DIAGNOSIS — G8929 Other chronic pain: Secondary | ICD-10-CM

## 2023-12-13 DIAGNOSIS — M25561 Pain in right knee: Secondary | ICD-10-CM | POA: Diagnosis not present

## 2023-12-13 HISTORY — DX: Hypokalemia: E87.6

## 2023-12-13 MED ORDER — IBUPROFEN 800 MG PO TABS
800.0000 mg | ORAL_TABLET | Freq: Three times a day (TID) | ORAL | 0 refills | Status: AC | PRN
Start: 2023-12-13 — End: ?

## 2023-12-13 NOTE — ED Triage Notes (Signed)
 C/O right knee pain and swelling onset a few weeks ago intermittently. States went to ortho last wk and was told arthritis.

## 2023-12-13 NOTE — ED Provider Notes (Signed)
 [facility]    CSN: [id number] Arrival date & time: 12/13/23  1923      History   Chief Complaint Chief Complaint  Patient presents with   Knee Pain    HPI Danielle [name] is a 53 y.o. female.    Knee Pain  Here for right knee pain and swelling.  It started hurting her some months ago.  She saw her primary care provider and then orthopedics last week.  Orthopedics did x-rays and she was told that this seemed to be arthritis.  Then in the last week or so it has been swelling more and been more painful.  No trauma.  NKDA  She has been taking maybe 1 of a 200 mg ibuprofen  or 1 Tylenol  as needed.    Past Medical History:  Diagnosis Date   GERD (gastroesophageal reflux disease)    Hypertension    Hyperthyroidism    Hypokalemia    Hypothyroidism    Thyroid  disease    hyper and hypo    Patient Active Problem List   Diagnosis Date Noted   Thumb pain, left 09/20/2022   ASCUS of cervix with negative high risk HPV on 07/27/2022 08/07/2022   Fall 02/13/2013    Past Surgical History:  Procedure Laterality Date   CESAREAN SECTION     CESAREAN SECTION N/A 08/22/2012   Procedure: CESAREAN SECTION;  Surgeon: Olam Mill, MD;  Location: [facility];  Service: Obstetrics;  Laterality: N/A;   DILATION AND CURETTAGE OF UTERUS     TUBAL LIGATION Bilateral 08/22/2012   Procedure: BILATERAL TUBAL LIGATION;  Surgeon: Olam Mill, MD;  Location: [facility];  Service: Obstetrics;  Laterality: Bilateral;    OB History     Gravida  8   Para  4   Term  4   Preterm      AB  4   Living  2      SAB  1   IAB  2   Ectopic  1   Multiple  0   Live Births  2            Home Medications    Prior to Admission medications   Medication Sig Start Date End Date Taking? Authorizing Provider  cetirizine (ZYRTEC) 10 MG tablet Take 10 mg by mouth daily.   Yes [provider]  chlorthalidone (HYGROTON) 25 MG tablet Take 25 mg by mouth daily.   Yes  [provider]  hydrALAZINE (APRESOLINE) 50 MG tablet Take 50 mg by mouth in the morning and at bedtime.   Yes [provider]  ibuprofen  (ADVIL ) 800 MG tablet Take 1 tablet (800 mg total) by mouth every 8 (eight) hours as needed (pain). 12/13/23  Yes Chizuko Trine K, MD  Niacinamide (VITAMIN B3 PO) Take by mouth.   Yes [provider]  NIFEdipine  (PROCARDIA -XL/NIFEDICAL-XL) 30 MG 24 hr tablet Take 1 tablet (30 mg total) by mouth daily. 11/05/21  Yes Long, Joshua G, MD  potassium chloride  SA (KLOR-CON  M) 20 MEQ tablet Take 1 tablet (20 mEq total) by mouth daily. 12/13/22  Yes Joesph Shaver Scales, PA-C  clobetasol  ointment (TEMOVATE ) 0.05 % Apply to affected area every night for 4 weeks, then every other day for 4 weeks and then twice a week for 4 weeks or until resolution. 07/27/22   Anyanwu, Ugonna A, MD  Elastic Bandages & Supports (ELBOW COPPER -INFUSED SLEEVE) MISC 1 Application by Does not apply route as needed. 05/07/23   Blaise Mungo  MALVA, MD    Family History Family History  Problem Relation Age of Onset   Hypertension Mother    Diabetes Father    Cancer Father    Hyperlipidemia Neg Hx    Heart attack Neg Hx    Sudden death Neg Hx     Social History Social History   Tobacco Use   Smoking status: Former    Types: Cigarettes   Smokeless tobacco: Never   Tobacco comments:    Quit November 20, 2023  Vaping Use   Vaping status: Never Used  Substance Use Topics   Alcohol use: Yes    Comment: occasionally   Drug use: No     Allergies   Condoms - female [nonoxynol 9]   Review of Systems Review of Systems   Physical Exam Triage Vital Signs ED Triage Vitals [12/13/23 1943]  Encounter Vitals Group     BP (!) 145/86     Girls Systolic BP Percentile      Girls Diastolic BP Percentile      Boys Systolic BP Percentile      Boys Diastolic BP Percentile      Pulse Rate 88     Resp 16     Temp 98 F (36.7 C)     Temp Source Oral     SpO2 98 %      Weight      Height      Head Circumference      Peak Flow      Pain Score 8     Pain Loc      Pain Education      Exclude from Growth Chart    No data found.  Updated Vital Signs BP (!) 145/86   Pulse 88   Temp 98 F (36.7 C) (Oral)   Resp 16   SpO2 98%   Visual Acuity Right Eye Distance:   Left Eye Distance:   Bilateral Distance:    Right Eye Near:   Left Eye Near:    Bilateral Near:     Physical Exam Vitals reviewed.  Constitutional:      General: She is not in acute distress.    Appearance: She is not ill-appearing, toxic-appearing or diaphoretic.  Musculoskeletal:     Comments: There is a small effusion of the right knee.  She has some tenderness over the lateral joint line.  No joint laxity.  There is no swelling of the ankle at this time.  Skin:    Coloration: Skin is not jaundiced or pale.  Neurological:     Mental Status: She is alert and oriented to person, place, and time.  Psychiatric:        Behavior: Behavior normal.      UC Treatments / Results  Labs (all labs ordered are listed, but only abnormal results are displayed) Labs Reviewed - No data to display  EKG   Radiology No results found.  Procedures Procedures (including critical care time)  Medications Ordered in UC Medications - No data to display  Initial Impression / Assessment and Plan / UC Course  I have reviewed the triage vital signs and the nursing notes.  Pertinent labs & imaging results that were available during my care of the patient were reviewed by me and considered in my medical decision making (see chart for details).     Knee sleeve braces provided.  Ibuprofen  800 mg prescription is sent to the pharmacy  I have asked her to follow-up with  primary care at least and probably also should follow-up with orthopedics Final Clinical Impressions(s) / UC Diagnoses   Final diagnoses:  Chronic pain of right knee     Discharge Instructions      Take ibuprofen  800  mg--1 tab every 8 hours as needed for pain.  Please follow-up with your orthopedic provider and your primary care provider    ED Prescriptions     Medication Sig Dispense Auth. Provider   ibuprofen  (ADVIL ) 800 MG tablet Take 1 tablet (800 mg total) by mouth every 8 (eight) hours as needed (pain). 21 tablet Allyssa Abruzzese K, MD      PDMP not reviewed this encounter.   Vonna Sharlet POUR, MD 12/13/23 [phone]

## 2023-12-13 NOTE — Discharge Instructions (Signed)
 Take ibuprofen  800 mg--1 tab every 8 hours as needed for pain.  Please follow-up with your orthopedic provider and your primary care provider

## 2024-01-08 ENCOUNTER — Other Ambulatory Visit: Payer: Self-pay | Admitting: Family Medicine

## 2024-01-08 DIAGNOSIS — Z1231 Encounter for screening mammogram for malignant neoplasm of breast: Secondary | ICD-10-CM

## 2024-01-23 ENCOUNTER — Ambulatory Visit
Admission: RE | Admit: 2024-01-23 | Discharge: 2024-01-23 | Disposition: A | Discharge status: Home / Self Care | Source: Ambulatory Visit | Attending: Family Medicine | Admitting: Family Medicine

## 2024-01-23 DIAGNOSIS — Z1231 Encounter for screening mammogram for malignant neoplasm of breast: Secondary | ICD-10-CM

## 2024-03-24 ENCOUNTER — Encounter: Payer: Self-pay | Admitting: Radiology

## 2024-06-18 ENCOUNTER — Ambulatory Visit (HOSPITAL_COMMUNITY): Admission: EM | Admit: 2024-06-18 | Discharge: 2024-06-18 | Disposition: A | Discharge status: Home / Self Care

## 2024-06-18 ENCOUNTER — Encounter (HOSPITAL_COMMUNITY): Payer: Self-pay

## 2024-06-18 ENCOUNTER — Ambulatory Visit (HOSPITAL_COMMUNITY)

## 2024-06-18 DIAGNOSIS — R109 Unspecified abdominal pain: Secondary | ICD-10-CM | POA: Insufficient documentation

## 2024-06-18 DIAGNOSIS — R143 Flatulence: Secondary | ICD-10-CM | POA: Diagnosis not present

## 2024-06-18 LAB — POCT URINALYSIS DIP (MANUAL ENTRY)
Bilirubin, UA: NEGATIVE
Blood, UA: NEGATIVE
Glucose, UA: NEGATIVE mg/dL
Ketones, POC UA: NEGATIVE mg/dL
Nitrite, UA: NEGATIVE
Spec Grav, UA: 1.025
Urobilinogen, UA: 0.2 U/dL
pH, UA: 5.5

## 2024-06-18 MED ORDER — POLYETHYLENE GLYCOL 3350 17 GM/SCOOP PO POWD: 17.0000 g | Freq: Every day | ORAL | 0 refills | Status: AC

## 2024-06-18 MED ORDER — SIMETHICONE 125 MG PO CAPS: 1.0000 | ORAL_CAPSULE | Freq: Three times a day (TID) | ORAL | 0 refills | Status: AC | PRN

## 2024-06-18 NOTE — ED Triage Notes (Signed)
 Pt states LLQ abdominal pain for the past 2 weeks. States she thinks its gas because she doesn't eat all the time.

## 2024-06-18 NOTE — Discharge Instructions (Signed)
" °  1. Abdominal cramping (Primary) - POCT urinalysis dipstick completed today in UC shows trace leukocytes, trace blood, no nitrite, no significant sign of urinary infection. - DG Abd 1 View x-ray performed today due to feeling of increased abdominal pressure and flatulence.  No significant sign of increased stool burden, nonobstructive bowel gas pattern. - Urine Culture collected in UC today and sent to lab for further testing results should be available in 2 to 3 days  2. Flatulence symptom - Simethicone  125 MG CAPS; Take 1 capsule (125 mg total) by mouth 3 (three) times daily as needed.  Dispense: 28 capsule; Refill: 0 - polyethylene glycol powder (MIRALAX ) 17 GM/SCOOP powder; Take 17 g by mouth daily. Dissolve 1 capful (17g) in 4-8 ounces of liquid and take by mouth daily.  Dispense: 238 g; Refill: 0  -Continue to monitor symptoms for any change in severity if there is any escalation of current symptoms or development of new symptoms follow-up in ER for further evaluation and management. "

## 2024-06-18 NOTE — ED Provider Notes (Signed)
 " [facility]  Note:  This document was prepared using Dragon voice recognition software and may include unintentional dictation errors.  MRN: [id number] DOB: 08-16-1970  Subjective:   Danielle [name] is a 54 y.o. female presenting for left lower quadrant abdominal pain with increased abdominal pressure off-and-on x 2 weeks.  Patient reports normal bowel movements, no nausea/vomiting.  Patient thinks that she may have increased gas.  Patient denies any significant changes to bowel or bladder habits, appetite, ingestion of abnormal foods.  No fever, shortness of breath, chest pain, weakness, dizziness, back pain.  Patient denies taking any over-the-counter medications to treat symptoms prior to arrival in urgent care today.  Current Medications[1]   Allergies[2]  Past Medical History:  Diagnosis Date   GERD (gastroesophageal reflux disease)    Hypertension    Hyperthyroidism    Hypokalemia    Hypothyroidism    Thyroid  disease    hyper and hypo     Past Surgical History:  Procedure Laterality Date   CESAREAN SECTION     CESAREAN SECTION N/A 08/22/2012   Procedure: CESAREAN SECTION;  Surgeon: Olam Mill, MD;  Location: [facility];  Service: Obstetrics;  Laterality: N/A;   DILATION AND CURETTAGE OF UTERUS     TUBAL LIGATION Bilateral 08/22/2012   Procedure: BILATERAL TUBAL LIGATION;  Surgeon: Olam Mill, MD;  Location: [facility];  Service: Obstetrics;  Laterality: Bilateral;    Family History  Problem Relation Age of Onset   Hypertension Mother    Diabetes Father    Cancer Father    Hyperlipidemia Neg Hx    Heart attack Neg Hx    Sudden death Neg Hx     Social History[3]  ROS Refer to HPI for ROS details.  Objective:    Vitals: BP (!) 131/92 (BP Location: Right Arm)   Pulse 95   Temp 98.4 F (36.9 C) (Oral)   Resp 16   SpO2 98%   Physical Exam Vitals and nursing note reviewed.  Constitutional:      General: She is not in acute  distress.    Appearance: She is well-developed. She is not ill-appearing or toxic-appearing.  HENT:     Head: Normocephalic and atraumatic.     Mouth/Throat:     Mouth: Mucous membranes are moist.  Cardiovascular:     Rate and Rhythm: Normal rate.  Pulmonary:     Effort: Pulmonary effort is normal. No respiratory distress.     Breath sounds: No stridor. No wheezing.  Abdominal:     General: There is no distension.     Tenderness: There is no abdominal tenderness. There is no right CVA tenderness or left CVA tenderness.  Skin:    General: Skin is warm and dry.  Neurological:     General: No focal deficit present.     Mental Status: She is alert and oriented to person, place, and time.  Psychiatric:        Mood and Affect: Mood normal.        Behavior: Behavior normal.     Procedures  Results for orders placed or performed during the hospital encounter of 06/18/24 (from the past 24 hours)  POCT urinalysis dipstick     Status: Abnormal   Collection Time: 06/18/24  5:55 PM  Result Value Ref Range   Color, UA yellow yellow   Clarity, UA clear clear   Glucose, UA negative negative mg/dL   Bilirubin, UA negative negative   Ketones, POC UA negative negative  mg/dL   Spec Grav, UA 8.974 8.989 - 1.025   Blood, UA negative negative   pH, UA 5.5 5.0 - 8.0   Protein Ur, POC trace (A) negative mg/dL   Urobilinogen, UA 0.2 0.2 or 1.0 E.U./dL   Nitrite, UA Negative Negative   Leukocytes, UA Trace (A) Negative    Assessment and Plan :     Discharge Instructions       1. Abdominal cramping (Primary) - POCT urinalysis dipstick completed today in UC shows trace leukocytes, trace blood, no nitrite, no significant sign of urinary infection. - DG Abd 1 View x-ray performed today due to feeling of increased abdominal pressure and flatulence.  No significant sign of increased stool burden, nonobstructive bowel gas pattern. - Urine Culture collected in UC today and sent to lab for  further testing results should be available in 2 to 3 days  2. Flatulence symptom - Simethicone  125 MG CAPS; Take 1 capsule (125 mg total) by mouth 3 (three) times daily as needed.  Dispense: 28 capsule; Refill: 0 - polyethylene glycol powder (MIRALAX ) 17 GM/SCOOP powder; Take 17 g by mouth daily. Dissolve 1 capful (17g) in 4-8 ounces of liquid and take by mouth daily.  Dispense: 238 g; Refill: 0  -Continue to monitor symptoms for any change in severity if there is any escalation of current symptoms or development of new symptoms follow-up in ER for further evaluation and management.      [name]    [1] No current facility-administered medications for this encounter.  Current Outpatient Medications:    polyethylene glycol powder (MIRALAX ) 17 GM/SCOOP powder, Take 17 g by mouth daily. Dissolve 1 capful (17g) in 4-8 ounces of liquid and take by mouth daily., Disp: 238 g, Rfl: 0   Simethicone  125 MG CAPS, Take 1 capsule (125 mg total) by mouth 3 (three) times daily as needed., Disp: 28 capsule, Rfl: 0   cetirizine (ZYRTEC) 10 MG tablet, Take 10 mg by mouth daily., Disp: , Rfl:    chlorthalidone (HYGROTON) 25 MG tablet, Take 25 mg by mouth daily., Disp: , Rfl:    clobetasol  ointment (TEMOVATE ) 0.05 %, Apply to affected area every night for 4 weeks, then every other day for 4 weeks and then twice a week for 4 weeks or until resolution., Disp: 60 g, Rfl: 5   Elastic Bandages & Supports (ELBOW COPPER -INFUSED SLEEVE) MISC, 1 Application by Does not apply route as needed., Disp: 1 each, Rfl: 0   hydrALAZINE (APRESOLINE) 50 MG tablet, Take 50 mg by mouth in the morning and at bedtime., Disp: , Rfl:    ibuprofen  (ADVIL ) 800 MG tablet, Take 1 tablet (800 mg total) by mouth every 8 (eight) hours as needed (pain)., Disp: 21 tablet, Rfl: 0   Niacinamide (VITAMIN B3 PO), Take by mouth., Disp: , Rfl:    NIFEdipine  (PROCARDIA -XL/NIFEDICAL-XL) 30 MG 24 hr tablet, Take 1 tablet (30 mg total) by  mouth daily., Disp: 30 tablet, Rfl: 0   potassium chloride  SA (KLOR-CON  M) 20 MEQ tablet, Take 1 tablet (20 mEq total) by mouth daily., Disp: 4 tablet, Rfl: 0 [2]  Allergies Allergen Reactions   Condoms - Female [Nonoxynol 9] Hives    Certain brand she was not sure about.  [3]  Social History Tobacco Use   Smoking status: Former    Types: Cigarettes   Smokeless tobacco: Never   Tobacco comments:    Quit November 20, 2023  Vaping Use   Vaping status: Never  Used  Substance Use Topics   Alcohol use: Yes    Comment: occasionally   Drug use: No     Aurea Goodell B, NP 06/18/24 1836  "

## 2024-06-19 LAB — URINE CULTURE
Culture: <10000 — AB
Special Requests: NORMAL

## 2024-06-20 ENCOUNTER — Ambulatory Visit (HOSPITAL_COMMUNITY): Payer: Self-pay
